# Patient Record
Sex: Male | Born: 1960 | Race: Black or African American | Hispanic: No | Marital: Single | Smoking: Never smoker
Health system: Southern US, Community
[De-identification: ages and names within clinical notes are randomized; demographics above are authoritative.]

## PROBLEM LIST (undated history)

## (undated) DIAGNOSIS — K5900 Constipation, unspecified: Secondary | ICD-10-CM

## (undated) DIAGNOSIS — J329 Chronic sinusitis, unspecified: Secondary | ICD-10-CM

## (undated) DIAGNOSIS — E039 Hypothyroidism, unspecified: Secondary | ICD-10-CM

## (undated) DIAGNOSIS — E7800 Pure hypercholesterolemia, unspecified: Secondary | ICD-10-CM

## (undated) DIAGNOSIS — K219 Gastro-esophageal reflux disease without esophagitis: Secondary | ICD-10-CM

## (undated) DIAGNOSIS — K589 Irritable bowel syndrome without diarrhea: Secondary | ICD-10-CM

## (undated) HISTORY — DX: Constipation, unspecified: K59.00

## (undated) HISTORY — DX: Pure hypercholesterolemia, unspecified: E78.00

## (undated) HISTORY — PX: ESOPHAGOGASTRODUODENOSCOPY: SHX1529

## (undated) HISTORY — PX: WISDOM TOOTH EXTRACTION: SHX21

## (undated) HISTORY — DX: Hypothyroidism, unspecified: E03.9

## (undated) HISTORY — DX: Irritable bowel syndrome, unspecified: K58.9

## (undated) HISTORY — DX: Chronic sinusitis, unspecified: J32.9

## (undated) HISTORY — PX: COLONOSCOPY: SHX174

## (undated) HISTORY — DX: Gastro-esophageal reflux disease without esophagitis: K21.9

---

## 2007-09-28 ENCOUNTER — Ambulatory Visit: Payer: Self-pay | Admitting: Internal Medicine

## 2008-09-23 ENCOUNTER — Ambulatory Visit: Payer: Self-pay | Admitting: Internal Medicine

## 2010-07-16 ENCOUNTER — Ambulatory Visit: Payer: Self-pay | Admitting: Gastroenterology

## 2010-07-19 LAB — PATHOLOGY REPORT

## 2011-01-01 ENCOUNTER — Ambulatory Visit: Payer: Self-pay

## 2014-06-03 ENCOUNTER — Ambulatory Visit: Payer: Self-pay | Admitting: Gastroenterology

## 2014-06-07 LAB — PATHOLOGY REPORT

## 2016-11-21 ENCOUNTER — Telehealth: Payer: Self-pay | Admitting: Gastroenterology

## 2016-11-21 NOTE — Telephone Encounter (Signed)
Left voice message for patient to call and schedule with GI for IBS. [facility] by [facility]

## 2016-11-26 ENCOUNTER — Encounter: Payer: Self-pay | Admitting: Gastroenterology

## 2016-11-26 ENCOUNTER — Telehealth: Payer: Self-pay | Admitting: Gastroenterology

## 2016-11-26 NOTE — Telephone Encounter (Signed)
Left voice message for patient to call and schedule appt with GI for IBS

## 2016-11-28 NOTE — Telephone Encounter (Signed)
error 

## 2016-12-30 ENCOUNTER — Encounter: Payer: Self-pay | Admitting: Gastroenterology

## 2016-12-30 ENCOUNTER — Telehealth: Payer: Self-pay

## 2016-12-30 ENCOUNTER — Other Ambulatory Visit
Admission: RE | Admit: 2016-12-30 | Discharge: 2016-12-30 | Disposition: A | Discharge status: Home / Self Care | Payer: TRICARE For Life (TFL) | Source: Ambulatory Visit | Attending: Gastroenterology | Admitting: Gastroenterology

## 2016-12-30 ENCOUNTER — Other Ambulatory Visit: Payer: Self-pay

## 2016-12-30 ENCOUNTER — Ambulatory Visit (INDEPENDENT_AMBULATORY_CARE_PROVIDER_SITE_OTHER): Payer: TRICARE For Life (TFL) | Admitting: Gastroenterology

## 2016-12-30 VITALS — BP 124/78 | HR 78 | Temp 97.9°F | Ht 67.0 in | Wt 161.4 lb

## 2016-12-30 DIAGNOSIS — K9289 Other specified diseases of the digestive system: Secondary | ICD-10-CM

## 2016-12-30 DIAGNOSIS — R194 Change in bowel habit: Secondary | ICD-10-CM | POA: Diagnosis not present

## 2016-12-30 DIAGNOSIS — Z860100 Personal history of colon polyps, unspecified: Secondary | ICD-10-CM

## 2016-12-30 NOTE — Progress Notes (Signed)
Gastroenterology Consultation  Referring Provider:    [facility]  Primary Care Physician:  [facility] Primary Gastroenterologist:  Dr. Wyline Mood  Reason for Consultation:     IBS        HPI:   Roger Mclaughlin is a 56 y.o. y/o male referred for consultation & management  by Dr. Sander [facility]    He has been referred for IBS. He has last been seen at the [place] [facility] back in 2015. Per last office note in 08/2014 he underwent a colonoscopy  With an adenoma and random colon bx were normal and EGD in 05/2014, father had colon cancer . He has been treated for IBS-C. He has tried [place], linzess in the past .   He says that he is here to discuss about constant cramping and pain in the rectal area, gas pressure, ongoing for years and getting worse.   This occurs daily , the only time he feels good is when he wakes up , after 20 mins has the pressure, feels like he needs to use the restroom, but if he did go to the restroom has a lot of pressure. When he has an enema he is able to get some stool out and get some relief in pressure.   He has a bowel movement without the enema once every day but some in qty, without an enema may not have a bowel movement . He has a sensation of incomplete defecation. When he does have a bowel movement with miralax its not very hard, not hard to flush , often small in size , skinny . This is something new . No blood on stool. He says he only has a "decent bowel movement " only if he sits for 90 mins . He does have a lot of bloating , feels it hard to put on his pants due to distension. The pressure correlates to the bloating , feels better when he passes some gas, Does not wake him up in the night . Worse with dairy .   Chews 10-15 sticks of gum a day for many years, consumes cinnamon discs. Mostly chews in the day .  He does have milder symptoms during the weekend .     Past Medical History:  Diagnosis Date  . Hypercholesteremia     . Hypothyroidism   . Irritable bowel syndrome     No past surgical history on file.  Prior to Admission medications   Medication Sig Start Date End Date Taking? Authorizing Provider  atorvastatin (LIPITOR) 20 MG tablet  12/16/16  Yes Historical Provider, MD  levothyroxine (SYNTHROID, LEVOTHROID) 50 MCG tablet  12/12/16  Yes Historical Provider, MD  SYMAX DUOTAB 0.375 MG TBCR  12/27/16  Yes Historical Provider, MD    Family History  Problem Relation Age of Onset  . Irritable bowel syndrome Mother   . Hypertension Mother   . Diabetes Mother   . Prostate cancer Father   . Hypertension Father   . Diabetes Father   . Irritable bowel syndrome Sister      Social History  Substance Use Topics  . Smoking status: Never Smoker  . Smokeless tobacco: Never Used  . Alcohol use 4.2 oz/week    7 Glasses of wine per week    Allergies as of 12/30/2016  . (No Known Allergies)    Review of Systems:    All systems reviewed and negative except where noted in HPI.   Physical Exam:  BP 124/78 (BP Location: Left Arm, Patient Position: Sitting, Cuff Size: Normal)   Pulse 78   Temp 97.9 F (36.6 C) (Oral)   Ht  (1.702 m)   Wt 161 lb 6.4 oz (73.2 kg)   BMI 25.28 kg/m  No LMP for male patient. Psych:  Alert and cooperative. Normal mood and affect. General:   Alert,  Well-developed, well-nourished, pleasant and cooperative in NAD Head:  Normocephalic and atraumatic. Eyes:  Sclera clear, no icterus.   Conjunctiva pink. Ears:  Normal auditory acuity. Nose:  No deformity, discharge, or lesions. Mouth:  No deformity or lesions,oropharynx pink & moist. Neck:  Supple; no masses or thyromegaly. Lungs:  Respirations even and unlabored.  Clear throughout to auscultation.   No wheezes, crackles, or rhonchi. No acute distress. Heart:  Regular rate and rhythm; no murmurs, clicks, rubs, or gallops. Abdomen:  Normal bowel sounds.  No bruits.  Soft, non-tender and non-distended without masses,  hepatosplenomegaly or hernias noted.  No guarding or rebound tenderness.    Pulses:  Normal pulses noted. Extremities:  No clubbing or edema.  No cyanosis. Neurologic:  Alert and oriented x3;  grossly normal neurologically. Psych:  Alert and cooperative. Normal mood and affect.  Imaging Studies: No results found.  Assessment and Plan:   Roger Mclaughlin is a 56 y.o. y/o male has been referred for IBS-C. His history is more suggetsive of gas and bloating  Secondary to consumption of a large qty of chewing gum with artificial sugar. In addition he has some change in the shape of his stool.   Plan  1. Stop all chewing gum 2. LOW FODMAP diet  3. Check celiac serology  4. Colonoscopy to evaluate change in bowel habits 5. If above does not help then will treat with a course of antibiotics for small bowel bacterial overgrowth syndrome.   Follow up in 8-12 weeks   Dr Wyline Mood MD

## 2016-12-30 NOTE — Telephone Encounter (Signed)
Gastroenterology Pre-Procedure Review  Request Date: 5/3 Requesting Physician: Dr. Tobi Bastos  PATIENT REVIEW QUESTIONS: The patient responded to the following health history questions as indicated:    1. Are you having any GI issues? yes (IBS) 2. Do you have a personal history of Polyps? yes (removed) 3. Do you have a family history of Colon Cancer or Polyps? no 4. Diabetes Mellitus? no 5. Joint replacements in the past 12 months?no 6. Major health problems in the past 3 months?no 7. Any artificial heart valves, MVP, or defibrillator?no    MEDICATIONS & ALLERGIES:    Patient reports the following regarding taking any anticoagulation/antiplatelet therapy:   Plavix, Coumadin, Eliquis, Xarelto, Lovenox, Pradaxa, Brilinta, or Effient? no Aspirin? no  Patient confirms/reports the following medications:  Current Outpatient Prescriptions  Medication Sig Dispense Refill  . atorvastatin (LIPITOR) 20 MG tablet     . levothyroxine (SYNTHROID, LEVOTHROID) 50 MCG tablet     . SYMAX DUOTAB 0.375 MG TBCR      No current facility-administered medications for this visit.     Patient confirms/reports the following allergies:  No Known Allergies  No orders of the defined types were placed in this encounter.   AUTHORIZATION INFORMATION Primary Insurance: 1D#: Group #:  Secondary Insurance: 1D#: Group #:  SCHEDULE INFORMATION: Date: 5/3 Time: Location: [facility]

## 2017-01-01 ENCOUNTER — Telehealth: Payer: Self-pay

## 2017-01-01 ENCOUNTER — Telehealth: Payer: Self-pay | Admitting: Gastroenterology

## 2017-01-01 LAB — CELIAC DISEASE PANEL
ENDOMYSIAL ANTIBODY IGA: NEGATIVE
IgA: 137 mg/dL (ref 90–386)
Tissue Transglutaminase Ab, IgA: <2 U/mL (ref 0–3)

## 2017-01-01 NOTE — Telephone Encounter (Signed)
LVM for patient callback for results, per Dr. Anna.  

## 2017-01-01 NOTE — Telephone Encounter (Signed)
-----   Message from Wyline Mood, MD sent at 01/01/2017 12:07 PM EDT ----- Celiac serology negative

## 2017-01-01 NOTE — Telephone Encounter (Signed)
Roger Mclaughlin returned your call.

## 2017-01-07 ENCOUNTER — Telehealth: Payer: Self-pay | Admitting: Gastroenterology

## 2017-01-07 NOTE — Telephone Encounter (Signed)
01/07/17 Spoke with Jacqulyn Ducking with [facility] and NO prior auth is required for Colonoscopy 57846 / Z86.010.

## 2017-01-21 ENCOUNTER — Other Ambulatory Visit: Payer: Self-pay

## 2017-01-21 DIAGNOSIS — K589 Irritable bowel syndrome without diarrhea: Secondary | ICD-10-CM

## 2017-01-21 MED ORDER — NA SULFATE-K SULFATE-MG SULF 17.5-3.13-1.6 GM/177ML PO SOLN: 1.0000 | Freq: Once | ORAL | 0 refills | Status: AC

## 2017-01-22 ENCOUNTER — Encounter: Payer: Self-pay | Admitting: *Deleted

## 2017-01-23 ENCOUNTER — Ambulatory Visit
Admission: RE | Admit: 2017-01-23 | Discharge: 2017-01-23 | Disposition: A | Discharge status: Home / Self Care | Source: Ambulatory Visit | Attending: Gastroenterology | Admitting: Gastroenterology

## 2017-01-23 ENCOUNTER — Encounter
Admission: RE | Disposition: A | Discharge status: Home / Self Care | Payer: Self-pay | Source: Ambulatory Visit | Attending: Gastroenterology

## 2017-01-23 ENCOUNTER — Encounter: Payer: Self-pay | Admitting: *Deleted

## 2017-01-23 ENCOUNTER — Ambulatory Visit: Admitting: Anesthesiology

## 2017-01-23 DIAGNOSIS — E7800 Pure hypercholesterolemia, unspecified: Secondary | ICD-10-CM | POA: Diagnosis not present

## 2017-01-23 DIAGNOSIS — E039 Hypothyroidism, unspecified: Secondary | ICD-10-CM | POA: Insufficient documentation

## 2017-01-23 DIAGNOSIS — R194 Change in bowel habit: Secondary | ICD-10-CM

## 2017-01-23 DIAGNOSIS — Z79899 Other long term (current) drug therapy: Secondary | ICD-10-CM | POA: Insufficient documentation

## 2017-01-23 DIAGNOSIS — K219 Gastro-esophageal reflux disease without esophagitis: Secondary | ICD-10-CM | POA: Diagnosis not present

## 2017-01-23 DIAGNOSIS — K5730 Diverticulosis of large intestine without perforation or abscess without bleeding: Secondary | ICD-10-CM

## 2017-01-23 DIAGNOSIS — K589 Irritable bowel syndrome without diarrhea: Secondary | ICD-10-CM | POA: Diagnosis not present

## 2017-01-23 DIAGNOSIS — K640 First degree hemorrhoids: Secondary | ICD-10-CM | POA: Insufficient documentation

## 2017-01-23 DIAGNOSIS — Z86010 Personal history of colonic polyps: Secondary | ICD-10-CM

## 2017-01-23 HISTORY — PX: COLONOSCOPY WITH PROPOFOL: SHX5780

## 2017-01-23 SURGERY — COLONOSCOPY WITH PROPOFOL
Anesthesia: General

## 2017-01-23 MED ORDER — SODIUM CHLORIDE 0.9 % IV SOLN
INTRAVENOUS | Status: DC
  Administered 2017-01-23 (×2): via INTRAVENOUS

## 2017-01-23 MED ORDER — PROPOFOL 10 MG/ML IV BOLUS
INTRAVENOUS | Status: DC | PRN
Start: 2017-01-23 — End: 2017-01-23
  Administered 2017-01-23: 100 mg via INTRAVENOUS
  Administered 2017-01-23: 20 mg via INTRAVENOUS

## 2017-01-23 MED ORDER — PROPOFOL 500 MG/50ML IV EMUL
INTRAVENOUS | Status: DC | PRN
  Administered 2017-01-23: 175 ug/kg/min via INTRAVENOUS

## 2017-01-23 MED ORDER — PROPOFOL 500 MG/50ML IV EMUL
INTRAVENOUS | Status: AC
  Filled 2017-01-23: qty 50

## 2017-01-23 NOTE — Anesthesia Post-op Follow-up Note (Cosign Needed)
Anesthesia QCDR form completed.        

## 2017-01-23 NOTE — Op Note (Signed)
[facility] Gastroenterology Patient Name: Roger Mclaughlin Procedure Date: 01/23/2017 11:03 AM MRN: [id number] Account #: [id number] Date of Birth: 20-Oct-1960 Admit Type: Outpatient Age: 56 Room: [facility] ENDO ROOM 4 Gender: Male Note Status: Finalized Procedure:            Colonoscopy Indications:          Incidental change in bowel habits noted Providers:            Wyline Mood MD, MD Referring MD:         No Local Md, MD (Referring MD) Medicines:            Monitored Anesthesia Care Complications:        No immediate complications. Procedure:            Pre-Anesthesia Assessment:                       - Prior to the procedure, a History and Physical was                        performed, and patient medications, allergies and                        sensitivities were reviewed. The patient's tolerance of                        previous anesthesia was reviewed.                       - The risks and benefits of the procedure and the                        sedation options and risks were discussed with the                        patient. All questions were answered and informed                        consent was obtained.                       - ASA Grade Assessment: III - A patient with severe                        systemic disease.                       After obtaining informed consent, the colonoscope was                        passed under direct vision. Throughout the procedure,                        the patient's blood pressure, pulse, and oxygen                        saturations were monitored continuously. The                        Colonoscope was introduced through the anus and  advanced to the the cecum, identified by the                        appendiceal orifice, IC valve and transillumination.                        The colonoscopy was performed with ease. The patient                        tolerated the procedure well. The quality of  the bowel                        preparation was good. Findings:      The perianal and digital rectal examinations were normal.      Non-bleeding internal hemorrhoids were found during retroflexion. The       hemorrhoids were medium-sized and Grade I (internal hemorrhoids that do       not prolapse).      Normal mucosa was found in the entire colon. the mucosa appeared nodular       throughout the colon and biopsies were taken Biopsies were taken with a       cold forceps for histology.      Scattered small-mouthed diverticula were found in the entire colon. Impression:           - Non-bleeding internal hemorrhoids.                       - Normal mucosa in the entire examined colon. Biopsied.                       - Diverticulosis in the entire examined colon. Recommendation:       - Discharge patient to home (with escort).                       - Resume previous diet.                       - Continue present medications.                       - Repeat colonoscopy in 5 years for surveillance.                       - Await pathology results.                       - Return to my office in 8 weeks. Procedure Code(s):    --- Professional ---                       [phone], Colonoscopy, flexible; with biopsy, single or                        multiple Diagnosis Code(s):    --- Professional ---                       K64.0, First degree hemorrhoids                       K57.30, Diverticulosis of large intestine without  perforation or abscess without bleeding CPT copyright 2016 American Medical Association. All rights reserved. The codes documented in this report are preliminary and upon coder review may  be revised to meet current compliance requirements. Wyline Mood, MD Wyline Mood MD, MD 01/23/2017 11:22:06 AM This report has been signed electronically. Number of Addenda: 0 Note Initiated On: 01/23/2017 11:03 AM Scope Withdrawal Time: 0 hours 9 minutes 58 seconds  Total  Procedure Duration: 0 hours 12 minutes 39 seconds       [facility]

## 2017-01-23 NOTE — Transfer of Care (Signed)
Immediate Anesthesia Transfer of Care Note  Patient: Roger Mclaughlin  Procedure(s) Performed: Procedure(s): COLONOSCOPY WITH PROPOFOL (N/A)  Patient Location: PACU  Anesthesia Type:General  Level of Consciousness: sedated  Airway & Oxygen Therapy: Patient Spontanous Breathing and Patient connected to nasal cannula oxygen  Post-op Assessment: Report given to RN and Post -op Vital signs reviewed and stable  Post vital signs: Reviewed and stable  Last Vitals:  Vitals:   01/23/17 0950 01/23/17 1123  BP: 123/86 (P) 99/71  Pulse: 89   Resp: 16   Temp: (!) 35.8 C (P) 36.3 C    Last Pain:  Vitals:   01/23/17 1123  TempSrc: (P) Tympanic         Complications: No apparent anesthesia complications

## 2017-01-23 NOTE — Anesthesia Procedure Notes (Signed)
Date/Time: 01/23/2017 11:11 AM Performed by: Junious SilkNOLES, Roger Jenkinson Pre-anesthesia Checklist: Patient identified, Emergency Drugs available, Suction available, Patient being monitored and Timeout performed Oxygen Delivery Method: Nasal cannula

## 2017-01-23 NOTE — H&P (Signed)
  Wyline MoodKiran Mylin Hirano MD [address] [place], [place] [address] Phone: [phone] Fax : [phone]  Primary Care Physician:  Jenne Pane[facility]lc, [facility] Primary Gastroenterologist:  Dr. Wyline MoodKiran Lanice Folden   Pre-Procedure History & Physical: HPI:  Roger Mclaughlin is a 56 y.o. male is here for an colonoscopy.   Past Medical History:  Diagnosis Date  . Hypercholesteremia   . Hypothyroidism   . Irritable bowel syndrome     Past Surgical History:  Procedure Laterality Date  . COLONOSCOPY    . ESOPHAGOGASTRODUODENOSCOPY    . WISDOM TOOTH EXTRACTION      Prior to Admission medications   Medication Sig Start Date End Date Taking? Authorizing Provider  atorvastatin (LIPITOR) 20 MG tablet  12/16/16  Yes [provider]  levothyroxine (SYNTHROID, LEVOTHROID) 50 MCG tablet  12/12/16  Yes [provider]  SYMAX DUOTAB 0.375 MG TBCR  12/27/16  Yes [provider]    Allergies as of 12/30/2016  . (No Known Allergies)    Family History  Problem Relation Age of Onset  . Irritable bowel syndrome Mother   . Hypertension Mother   . Diabetes Mother   . Prostate cancer Father   . Hypertension Father   . Diabetes Father   . Irritable bowel syndrome Sister     Social History   Social History  . Marital status: Single    Spouse name: N/A  . Number of children: N/A  . Years of education: N/A   Occupational History  . Not on file.   Social History Main Topics  . Smoking status: Never Smoker  . Smokeless tobacco: Never Used  . Alcohol use 4.2 oz/week    7 Glasses of wine per week  . Drug use: No  . Sexual activity: Yes    Birth control/ protection: Condom   Other Topics Concern  . Not on file   Social History Narrative  . No narrative on file    Review of Systems: See HPI, otherwise negative ROS  Physical Exam: BP 123/86   Pulse 89   Temp (!) 96.5 F (35.8 C) (Tympanic)   Resp 16   Ht 5\' 7"  (1.702 m)   Wt 157 lb (71.2 kg)   SpO2 100%    BMI 24.59 kg/m  General:   Alert,  pleasant and cooperative in NAD Head:  Normocephalic and atraumatic. Neck:  Supple; no masses or thyromegaly. Lungs:  Clear throughout to auscultation.    Heart:  Regular rate and rhythm. Abdomen:  Soft, nontender and nondistended. Normal bowel sounds, without guarding, and without rebound.   Neurologic:  Alert and  oriented x4;  grossly normal neurologically.  Impression/Plan: Roger Mclaughlin is here for an colonoscopy to be performed for change in bowel habits   Risks, benefits, limitations, and alternatives regarding  colonoscopy have been reviewed with the patient.  Questions have been answered.  All parties agreeable.   Wyline MoodKiran Paeton Latouche, MD  01/23/2017, 10:27 AM

## 2017-01-23 NOTE — Anesthesia Preprocedure Evaluation (Signed)
Anesthesia Evaluation  Patient identified by MRN, date of birth, ID band Patient awake    Reviewed: Allergy & Precautions, H&P , NPO status , Patient's Chart, lab work & pertinent test results, reviewed documented beta blocker date and time   History of Anesthesia Complications Negative for: history of anesthetic complications  Airway Mallampati: II  TM Distance: >3 FB Neck ROM: full    Dental  (+) Caps, Teeth Intact   Pulmonary neg pulmonary ROS,           Cardiovascular Exercise Tolerance: Good negative cardio ROS       Neuro/Psych negative neurological ROS  negative psych ROS   GI/Hepatic Neg liver ROS, GERD  ,  Endo/Other  neg diabetesHypothyroidism   Renal/GU negative Renal ROS  negative genitourinary   Musculoskeletal   Abdominal   Peds  Hematology negative hematology ROS (+)   Anesthesia Other Findings Past Medical History: No date: Hypercholesteremia No date: Hypothyroidism No date: Irritable bowel syndrome   Reproductive/Obstetrics negative OB ROS                             Anesthesia Physical Anesthesia Plan  ASA: II  Anesthesia Plan: General   Post-op Pain Management:    Induction:   Airway Management Planned:   Additional Equipment:   Intra-op Plan:   Post-operative Plan:   Informed Consent: I have reviewed the patients History and Physical, chart, labs and discussed the procedure including the risks, benefits and alternatives for the proposed anesthesia with the patient or authorized representative who has indicated his/her understanding and acceptance.   Dental Advisory Given  Plan Discussed with: Anesthesiologist, CRNA and Surgeon  Anesthesia Plan Comments:         Anesthesia Quick Evaluation

## 2017-01-24 ENCOUNTER — Encounter: Payer: Self-pay | Admitting: Gastroenterology

## 2017-01-24 LAB — SURGICAL PATHOLOGY

## 2017-01-24 NOTE — Anesthesia Postprocedure Evaluation (Signed)
Anesthesia Post Note  Patient: Roger Mclaughlin  Procedure(s) Performed: Procedure(s) (LRB): COLONOSCOPY WITH PROPOFOL (N/A)  Patient location during evaluation: Endoscopy Anesthesia Type: General Level of consciousness: awake and alert Pain management: pain level controlled Vital Signs Assessment: post-procedure vital signs reviewed and stable Respiratory status: spontaneous breathing, nonlabored ventilation, respiratory function stable and patient connected to nasal cannula oxygen Cardiovascular status: blood pressure returned to baseline and stable Postop Assessment: no signs of nausea or vomiting Anesthetic complications: no     Last Vitals:  Vitals:   01/23/17 1143 01/23/17 1153  BP: (!) 119/93 (!) 128/91  Pulse: 71 78  Resp: 17 20  Temp:      Last Pain:  Vitals:   01/23/17 1123  TempSrc: Tympanic  PainSc: Asleep                 Lenard SimmerAndrew Achol Azpeitia

## 2017-01-27 ENCOUNTER — Encounter: Payer: Self-pay | Admitting: Gastroenterology

## 2017-01-27 NOTE — Progress Notes (Signed)
5 year colonoscopy due to family history

## 2017-10-29 ENCOUNTER — Telehealth: Payer: Self-pay | Admitting: Internal Medicine

## 2017-10-29 ENCOUNTER — Encounter: Payer: Self-pay | Admitting: Internal Medicine

## 2017-10-29 ENCOUNTER — Ambulatory Visit (INDEPENDENT_AMBULATORY_CARE_PROVIDER_SITE_OTHER): Admitting: Internal Medicine

## 2017-10-29 VITALS — BP 144/91 | HR 82 | Temp 97.4°F | Resp 16 | Ht 67.0 in | Wt 161.2 lb

## 2017-10-29 DIAGNOSIS — R030 Elevated blood-pressure reading, without diagnosis of hypertension: Secondary | ICD-10-CM | POA: Diagnosis not present

## 2017-10-29 DIAGNOSIS — Z0001 Encounter for general adult medical examination with abnormal findings: Secondary | ICD-10-CM

## 2017-10-29 DIAGNOSIS — E785 Hyperlipidemia, unspecified: Secondary | ICD-10-CM | POA: Diagnosis not present

## 2017-10-29 DIAGNOSIS — J0100 Acute maxillary sinusitis, unspecified: Secondary | ICD-10-CM

## 2017-10-29 MED ORDER — PREDNISONE 10 MG PO TABS: ORAL_TABLET | ORAL | 0 refills | Status: DC

## 2017-10-29 MED ORDER — AMOXICILLIN-POT CLAVULANATE 875-125 MG PO TABS: 1.0000 | ORAL_TABLET | Freq: Two times a day (BID) | ORAL | 0 refills | Status: DC

## 2017-10-29 NOTE — Progress Notes (Signed)
[facility] [address] [place], [place] [address]  Internal MEDICINE  Office Visit Note  Patient Name: Roger Mclaughlin  [id number]  [id number]  Date of Service: 10/29/2017  Chief Complaint  Patient presents with  . Sinus Problem    possible sinus infection    HPI Pt is here for a sick visit. Sinus congestion with postnasal drip Slight cough  Current Medication:  Outpatient Encounter Medications as of 10/29/2017  Medication Sig  . Acetaminophen-Caffeine (EXCEDRIN ASPIRIN FREE PO) Take by mouth daily.  Marland Kitchen atorvastatin (LIPITOR) 20 MG tablet Take 20 mg by mouth every evening.   Marland Kitchen esomeprazole (NEXIUM) 40 MG capsule Take 20 mg by mouth daily.  Marland Kitchen levothyroxine (SYNTHROID, LEVOTHROID) 50 MCG tablet Take 50 mcg by mouth daily before breakfast.   . polyethylene glycol powder (MIRALAX) powder Take 1 Container by mouth every evening.  Marland Kitchen SYMAX DUOTAB 0.375 MG TBCR daily.    No facility-administered encounter medications on file as of 10/29/2017.     Medical History: Past Medical History:  Diagnosis Date  . Chronic sinusitis   . Constipation   . GERD (gastroesophageal reflux disease)   . Hypercholesteremia   . Hypothyroidism   . Irritable bowel syndrome     Vital Signs: BP (!) 144/91   Pulse 82   Temp (!) 97.4 F (36.3 C)   Resp 16   Ht 5\' 7"  (1.702 m)   Wt 161 lb 3.2 oz (73.1 kg)   SpO2 99%   BMI 25.25 kg/m    Review of Systems  Constitutional: Negative for chills, fatigue and unexpected weight change.  HENT: Positive for postnasal drip and sore throat. Negative for congestion, rhinorrhea and sneezing.   Eyes: Negative for redness.  Respiratory: Positive for cough. Negative for chest tightness and shortness of breath.   Cardiovascular: Negative for chest pain and palpitations.  Gastrointestinal: Negative for abdominal pain, constipation, diarrhea, nausea and vomiting.  Genitourinary: Negative for dysuria and frequency.  Musculoskeletal: Negative for  arthralgias, back pain, joint swelling and neck pain.  Skin: Negative for rash.  Neurological: Negative.  Negative for tremors and numbness.  Hematological: Negative for adenopathy. Does not bruise/bleed easily.  Psychiatric/Behavioral: Negative for behavioral problems (Depression), sleep disturbance and suicidal ideas. The patient is not nervous/anxious.     Physical Exam  Constitutional: He is oriented to person, place, and time. He appears well-developed and well-nourished. No distress.  HENT:  Head: Normocephalic and atraumatic.  Mouth/Throat: Oropharynx is clear and moist. No oropharyngeal exudate.  Eyes: EOM are normal. Pupils are equal, round, and reactive to light.  Neck: Normal range of motion. Neck supple.  Cardiovascular: Normal rate, regular rhythm and normal heart sounds. Exam reveals no gallop and no friction rub.  No murmur heard. Pulmonary/Chest: Effort normal. He has no wheezes. He has no rales. He exhibits no tenderness.  Abdominal: Soft. Bowel sounds are normal.  Musculoskeletal: Normal range of motion.  Neurological: He is alert and oriented to person, place, and time. No cranial nerve deficit.  Skin: Skin is warm and dry. He is not diaphoretic.  Psychiatric: Judgment normal.    Assessment/Plan: 1. Acute non-recurrent maxillary sinusitis - Add Flonase  - amoxicillin-clavulanate (AUGMENTIN) 875-125 MG tablet; Take 1 tablet by mouth 2 (two) times daily.  Dispense: 20 tablet; Refill: 0 - predniSONE (DELTASONE) 10 MG tablet; 2 tabs 2 x day for 2 days  Dispense: 6 tablet; Refill: 0  2. Elevated blood pressure reading - Monitor for mow   3.  Hyperlipoproteinemia - Continue lipitor   General Counseling: Roger Mclaughlin verbalizes understanding of the findings of todays visit and agrees with plan of treatment. I have discussed any further diagnostic evaluation that may be needed or ordered today. We also reviewed his medications today. he has been encouraged to call the office  with any questions or concerns that should arise related to todays visit.   Time spent:25  Minutes

## 2017-11-13 ENCOUNTER — Other Ambulatory Visit: Payer: Self-pay | Admitting: Internal Medicine

## 2017-11-15 LAB — LIPID PANEL WITH LDL/HDL RATIO
Cholesterol, Total: 171 mg/dL (ref 100–199)
HDL: 69 mg/dL (ref >39)
LDL Calculated: 91 mg/dL (ref 0–99)
LDL/HDL RATIO: 1.3 ratio (ref 0.0–3.6)
TRIGLYCERIDES: 57 mg/dL (ref 0–149)
VLDL CHOLESTEROL CAL: 11 mg/dL (ref 5–40)

## 2017-11-15 LAB — CBC WITH DIFFERENTIAL/PLATELET
BASOS: 0 %
Basophils Absolute: 0 10*3/uL (ref 0.0–0.2)
EOS (ABSOLUTE): 0.1 10*3/uL (ref 0.0–0.4)
EOS: 3 %
HEMATOCRIT: 39.3 % (ref 37.5–51.0)
Hemoglobin: 13.3 g/dL (ref 13.0–17.7)
IMMATURE GRANS (ABS): 0 10*3/uL (ref 0.0–0.1)
IMMATURE GRANULOCYTES: 0 %
LYMPHS: 46 %
Lymphocytes Absolute: 1.7 10*3/uL (ref 0.7–3.1)
MCH: 29.9 pg (ref 26.6–33.0)
MCHC: 33.8 g/dL (ref 31.5–35.7)
MCV: 88 fL (ref 79–97)
MONOCYTES: 6 %
MONOS ABS: 0.2 10*3/uL (ref 0.1–0.9)
NEUTROS PCT: 45 %
Neutrophils Absolute: 1.7 10*3/uL (ref 1.4–7.0)
Platelets: 175 10*3/uL (ref 150–379)
RBC: 4.45 x10E6/uL (ref 4.14–5.80)
RDW: 13.5 % (ref 12.3–15.4)
WBC: 3.7 10*3/uL (ref 3.4–10.8)

## 2017-11-15 LAB — COMPREHENSIVE METABOLIC PANEL
ALBUMIN: 4.2 g/dL (ref 3.5–5.5)
ALT: 38 IU/L (ref 0–44)
AST: 30 IU/L (ref 0–40)
Albumin/Globulin Ratio: 1.8 (ref 1.2–2.2)
Alkaline Phosphatase: 74 IU/L (ref 39–117)
BILIRUBIN TOTAL: 0.5 mg/dL (ref 0.0–1.2)
BUN / CREAT RATIO: 13 (ref 9–20)
BUN: 15 mg/dL (ref 6–24)
CO2: 24 mmol/L (ref 20–29)
CREATININE: 1.2 mg/dL (ref 0.76–1.27)
Calcium: 9.5 mg/dL (ref 8.7–10.2)
Chloride: 105 mmol/L (ref 96–106)
GFR calc Af Amer: 78 mL/min/{1.73_m2} (ref >59)
GFR calc non Af Amer: 68 mL/min/{1.73_m2} (ref >59)
GLOBULIN, TOTAL: 2.4 g/dL (ref 1.5–4.5)
GLUCOSE: 90 mg/dL (ref 65–99)
Potassium: 4.5 mmol/L (ref 3.5–5.2)
Sodium: 142 mmol/L (ref 134–144)
TOTAL PROTEIN: 6.6 g/dL (ref 6.0–8.5)

## 2017-11-15 LAB — T4, FREE: Free T4: 1.13 ng/dL (ref 0.82–1.77)

## 2017-11-15 LAB — PSA: Prostate Specific Ag, Serum: 0.8 ng/mL (ref 0.0–4.0)

## 2017-11-15 LAB — TSH: TSH: 0.476 u[IU]/mL (ref 0.450–4.500)

## 2017-11-18 ENCOUNTER — Encounter: Payer: Self-pay | Admitting: Nurse Practitioner

## 2017-12-25 ENCOUNTER — Ambulatory Visit: Admitting: Nurse Practitioner

## 2017-12-25 ENCOUNTER — Encounter: Payer: Self-pay | Admitting: Nurse Practitioner

## 2017-12-25 VITALS — BP 137/93 | HR 74 | Resp 16 | Ht 67.0 in | Wt 160.0 lb

## 2017-12-25 DIAGNOSIS — K219 Gastro-esophageal reflux disease without esophagitis: Secondary | ICD-10-CM

## 2017-12-25 DIAGNOSIS — R300 Dysuria: Secondary | ICD-10-CM

## 2017-12-25 DIAGNOSIS — E039 Hypothyroidism, unspecified: Secondary | ICD-10-CM

## 2017-12-25 DIAGNOSIS — R1084 Generalized abdominal pain: Secondary | ICD-10-CM

## 2017-12-25 DIAGNOSIS — Z0001 Encounter for general adult medical examination with abnormal findings: Secondary | ICD-10-CM | POA: Diagnosis not present

## 2017-12-25 NOTE — Progress Notes (Signed)
[facility] [address] [place], [place] [address]  Internal MEDICINE  Office Visit Note  Patient Name: Roger Mclaughlin  [id number]  [id number]  Date of Service: 01/14/2018    Pt is here for routine health maintenance examination  Chief Complaint  Patient presents with  . Annual Exam  . Abdominal Pain    gas/cramping/diarrhea     The patient is here for health maintenance exam. He c/o chronic and intermittent abdominal pain. Present for years. Has been treated for IBS which does not seem to help at all. Colonoscopy in 01/2017 only showed internal hemorrhoids and diverticulosis.    Current Medication: Outpatient Encounter Medications as of 12/25/2017  Medication Sig  . Acetaminophen-Caffeine (EXCEDRIN ASPIRIN FREE PO) Take by mouth daily.  Marland Kitchen atorvastatin (LIPITOR) 20 MG tablet Take 20 mg by mouth every evening.   Marland Kitchen esomeprazole (NEXIUM) 40 MG capsule TAKE 1 CAPSULE DAILY FOR REFLUX  . levothyroxine (SYNTHROID, LEVOTHROID) 50 MCG tablet Take 50 mcg by mouth daily before breakfast.   . polyethylene glycol powder (MIRALAX) powder Take 1 Container by mouth every evening.  Marland Kitchen SYMAX DUOTAB 0.375 MG TBCR daily.   . predniSONE (DELTASONE) 10 MG tablet 2 tabs 2 x day for 2 days (Patient not taking: Reported on 12/25/2017)  . [DISCONTINUED] amoxicillin-clavulanate (AUGMENTIN) 875-125 MG tablet Take 1 tablet by mouth 2 (two) times daily. (Patient not taking: Reported on 12/25/2017)   No facility-administered encounter medications on file as of 12/25/2017.     Surgical History: Past Surgical History:  Procedure Laterality Date  . COLONOSCOPY    . COLONOSCOPY WITH PROPOFOL N/A 01/23/2017   Procedure: COLONOSCOPY WITH PROPOFOL;  Surgeon: Wyline Mood, MD;  Location: [facility] ENDOSCOPY;  Service: Endoscopy;  Laterality: N/A;  . ESOPHAGOGASTRODUODENOSCOPY    . WISDOM TOOTH EXTRACTION      Medical History: Past Medical History:  Diagnosis Date  . Chronic sinusitis   . Constipation    . GERD (gastroesophageal reflux disease)   . Hypercholesteremia   . Hypothyroidism   . Irritable bowel syndrome     Family History: Family History  Problem Relation Age of Onset  . Irritable bowel syndrome Mother   . Hypertension Mother   . Diabetes Mother   . Prostate cancer Father   . Hypertension Father   . Diabetes Father   . Irritable bowel syndrome Sister       Review of Systems  Constitutional: Positive for appetite change. Negative for chills, fatigue and unexpected weight change.  HENT: Negative for congestion, postnasal drip, rhinorrhea, sneezing and sore throat.   Eyes: Negative.  Negative for redness.  Respiratory: Negative for cough, chest tightness and shortness of breath.   Cardiovascular: Negative for chest pain and palpitations.  Gastrointestinal: Positive for abdominal pain, constipation and diarrhea. Negative for nausea and vomiting.       IBS - alternates between constipation and diarrhea.   Endocrine:       Well managed thyroid disease.   Genitourinary: Negative for dysuria and frequency.  Musculoskeletal: Negative for arthralgias, back pain, joint swelling and neck pain.  Skin: Negative for rash.  Allergic/Immunologic: Positive for environmental allergies.  Neurological: Positive for headaches. Negative for tremors and numbness.  Hematological: Negative for adenopathy. Does not bruise/bleed easily.  Psychiatric/Behavioral: Negative for behavioral problems (Depression), sleep disturbance and suicidal ideas. The patient is nervous/anxious.      Today's Vitals   12/25/17 1456  BP: (!) 137/93  Pulse: 74  Resp: 16  SpO2: 94%  Weight: 160 lb (72.6 kg)  Height: 5\' 7"  (1.702 m)    Physical Exam  Constitutional: He is oriented to person, place, and time. He appears well-developed and well-nourished. No distress.  HENT:  Head: Normocephalic and atraumatic.  Mouth/Throat: Oropharynx is clear and moist. No oropharyngeal exudate.  Eyes: Pupils are  equal, round, and reactive to light. EOM are normal.  Neck: Normal range of motion. Neck supple. No JVD present. No tracheal deviation present. No thyromegaly present.  Cardiovascular: Normal rate, regular rhythm, normal heart sounds and intact distal pulses. Exam reveals no gallop and no friction rub.  No murmur heard. Pulmonary/Chest: Effort normal and breath sounds normal. No respiratory distress. He has no wheezes. He has no rales. He exhibits no tenderness.  Abdominal: Soft. Normal appearance and bowel sounds are normal. There is no tenderness.  Musculoskeletal: Normal range of motion.  Lymphadenopathy:    He has no cervical adenopathy.  Neurological: He is alert and oriented to person, place, and time. No cranial nerve deficit.  Skin: Skin is warm and dry. Capillary refill takes less than 2 seconds. He is not diaphoretic.  Psychiatric: He has a normal mood and affect. His behavior is normal. Judgment and thought content normal.  Nursing note and vitals reviewed.    LABS: Recent Results (from the past 2160 hour(s))  CBC with Differential/Platelet     Status: None   Collection Time: 11/14/17  8:58 AM  Result Value Ref Range   WBC 3.7 3.4 - 10.8 x10E3/uL   RBC 4.45 4.14 - 5.80 x10E6/uL   Hemoglobin 13.3 13.0 - 17.7 g/dL   Hematocrit 16.139.3 09.637.5 - 51.0 %   MCV 88 79 - 97 fL   MCH 29.9 26.6 - 33.0 pg   MCHC 33.8 31.5 - 35.7 g/dL   RDW 04.513.5 40.912.3 - 81.115.4 %   Platelets 175 150 - 379 x10E3/uL   Neutrophils 45 Not Estab. %   Lymphs 46 Not Estab. %   Monocytes 6 Not Estab. %   Eos 3 Not Estab. %   Basos 0 Not Estab. %   Neutrophils Absolute 1.7 1.4 - 7.0 x10E3/uL   Lymphocytes Absolute 1.7 0.7 - 3.1 x10E3/uL   Monocytes Absolute 0.2 0.1 - 0.9 x10E3/uL   EOS (ABSOLUTE) 0.1 0.0 - 0.4 x10E3/uL   Basophils Absolute 0.0 0.0 - 0.2 x10E3/uL   Immature Granulocytes 0 Not Estab. %   Immature Grans (Abs) 0.0 0.0 - 0.1 x10E3/uL  Lipid Panel With LDL/HDL Ratio     Status: None   Collection  Time: 11/14/17  8:58 AM  Result Value Ref Range   Cholesterol, Total 171 100 - 199 mg/dL   Triglycerides 57 0 - 149 mg/dL   HDL 69 >91>39 mg/dL   VLDL Cholesterol Cal 11 5 - 40 mg/dL   LDL Calculated 91 0 - 99 mg/dL   LDl/HDL Ratio 1.3 0.0 - 3.6 ratio    Comment:                                     LDL/HDL Ratio                                             Men  Women  1/2 Avg.Risk  1.0    1.5                                   Avg.Risk  3.6    3.2                                2X Avg.Risk  6.2    5.0                                3X Avg.Risk  8.0    6.1   TSH     Status: None   Collection Time: 11/14/17  8:58 AM  Result Value Ref Range   TSH 0.476 0.450 - 4.500 uIU/mL  T4, free     Status: None   Collection Time: 11/14/17  8:58 AM  Result Value Ref Range   Free T4 1.13 0.82 - 1.77 ng/dL  Comprehensive metabolic panel     Status: None   Collection Time: 11/14/17  8:58 AM  Result Value Ref Range   Glucose 90 65 - 99 mg/dL   BUN 15 6 - 24 mg/dL   Creatinine, Ser 1.61 0.76 - 1.27 mg/dL   GFR calc non Af Amer 68 >59 mL/min/1.73   GFR calc Af Amer 78 >59 mL/min/1.73   BUN/Creatinine Ratio 13 9 - 20   Sodium 142 134 - 144 mmol/L   Potassium 4.5 3.5 - 5.2 mmol/L   Chloride 105 96 - 106 mmol/L   CO2 24 20 - 29 mmol/L   Calcium 9.5 8.7 - 10.2 mg/dL   Total Protein 6.6 6.0 - 8.5 g/dL   Albumin 4.2 3.5 - 5.5 g/dL   Globulin, Total 2.4 1.5 - 4.5 g/dL   Albumin/Globulin Ratio 1.8 1.2 - 2.2   Bilirubin Total 0.5 0.0 - 1.2 mg/dL   Alkaline Phosphatase 74 39 - 117 IU/L   AST 30 0 - 40 IU/L   ALT 38 0 - 44 IU/L  PSA     Status: None   Collection Time: 11/14/17  8:58 AM  Result Value Ref Range   Prostate Specific Ag, Serum 0.8 0.0 - 4.0 ng/mL    Comment: Roche ECLIA methodology. According to the American Urological Association, Serum PSA should decrease and remain at undetectable levels after radical prostatectomy. The AUA defines biochemical recurrence as  an initial PSA value 0.2 ng/mL or greater followed by a subsequent confirmatory PSA value 0.2 ng/mL or greater. Values obtained with different assay methods or kits cannot be used interchangeably. Results cannot be interpreted as absolute evidence of the presence or absence of malignant disease.   Urinalysis, Routine w reflex microscopic     Status: Abnormal   Collection Time: 12/25/17  3:10 PM  Result Value Ref Range   Specific Gravity, UA 1.006 1.005 - 1.030   pH, UA 8.0 (H) 5.0 - 7.5   Color, UA Yellow Yellow   Appearance Ur Clear Clear   Leukocytes, UA Negative Negative   Protein, UA Negative Negative/Trace   Glucose, UA Negative Negative   Ketones, UA Negative Negative   RBC, UA Negative Negative   Bilirubin, UA Negative Negative   Urobilinogen, Ur 0.2 0.2 - 1.0 mg/dL   Nitrite, UA Negative Negative   Microscopic Examination Comment     Comment: Microscopic not  indicated and not performed.    Assessment/Plan: 1. Encounter for general adult medical examination with abnormal findings Annual health maintenance exam today.  2. Generalized colicky abdominal pain Check labs, including amylase and lipase. Will get abdominal ultrasound for further evaluation.  - Lipase - Amylase - US Abdomen Complete; Future  3. Acquired hypothyroidism Thyroid panel stable. Continue levothyroxine as prescribed.   4. Gastroesophageal reflux disease without esophagitis Continue PPI as prescribed.  5. Dysuria - Urinalysis, Routine w reflex microscopic   General Counseling: ashland wiseman understanding of the findings of todays visit and agrees with plan of treatment. I have discussed any further diagnostic evaluation that may be needed or ordered today. We also reviewed his medications today. he has been encouraged to call the office with any questions or concerns that should arise related to todays visit.    This patient was seen by Vincent Gros, FNP- C in Collaboration with Dr  Lyndon Code as a part of collaborative care agreement    Orders Placed This Encounter  Procedures  . US Abdomen Complete  . Urinalysis, Routine w reflex microscopic  . Lipase  . Amylase      Time spent: 87 Minutes    Lyndon Code, MD  Internal Medicine

## 2017-12-26 LAB — URINALYSIS, ROUTINE W REFLEX MICROSCOPIC
BILIRUBIN UA: NEGATIVE
GLUCOSE, UA: NEGATIVE
KETONES UA: NEGATIVE
Leukocytes, UA: NEGATIVE
Nitrite, UA: NEGATIVE
PROTEIN UA: NEGATIVE
RBC UA: NEGATIVE
SPEC GRAV UA: 1.006 (ref 1.005–1.030)
UUROB: 0.2 mg/dL (ref 0.2–1.0)
pH, UA: 8 — ABNORMAL HIGH (ref 5.0–7.5)

## 2018-01-14 DIAGNOSIS — E039 Hypothyroidism, unspecified: Secondary | ICD-10-CM | POA: Insufficient documentation

## 2018-01-14 DIAGNOSIS — Z0001 Encounter for general adult medical examination with abnormal findings: Secondary | ICD-10-CM | POA: Insufficient documentation

## 2018-01-14 DIAGNOSIS — R300 Dysuria: Secondary | ICD-10-CM | POA: Insufficient documentation

## 2018-01-14 DIAGNOSIS — K219 Gastro-esophageal reflux disease without esophagitis: Secondary | ICD-10-CM | POA: Insufficient documentation

## 2018-01-14 DIAGNOSIS — R1084 Generalized abdominal pain: Secondary | ICD-10-CM | POA: Insufficient documentation

## 2018-01-20 LAB — AMYLASE: Amylase: 109 U/L (ref 31–124)

## 2018-01-20 LAB — LIPASE: Lipase: 46 U/L (ref 13–78)

## 2018-02-06 ENCOUNTER — Other Ambulatory Visit: Payer: Self-pay

## 2018-02-13 ENCOUNTER — Ambulatory Visit

## 2018-02-13 DIAGNOSIS — R1084 Generalized abdominal pain: Secondary | ICD-10-CM | POA: Diagnosis not present

## 2018-03-09 ENCOUNTER — Other Ambulatory Visit: Payer: Self-pay | Admitting: Internal Medicine

## 2018-03-10 ENCOUNTER — Other Ambulatory Visit: Payer: Self-pay

## 2018-03-10 MED ORDER — SYMAX DUOTAB 0.375 MG PO TBCR: EXTENDED_RELEASE_TABLET | ORAL | 1 refills | Status: DC

## 2018-03-25 ENCOUNTER — Other Ambulatory Visit: Payer: Self-pay

## 2018-03-25 MED ORDER — SYMAX DUOTAB 0.375 MG PO TBCR: EXTENDED_RELEASE_TABLET | ORAL | 2 refills | Status: DC

## 2018-06-09 ENCOUNTER — Other Ambulatory Visit: Payer: Self-pay | Admitting: Internal Medicine

## 2018-07-02 ENCOUNTER — Encounter: Payer: Self-pay | Admitting: Nurse Practitioner

## 2018-07-02 ENCOUNTER — Ambulatory Visit (INDEPENDENT_AMBULATORY_CARE_PROVIDER_SITE_OTHER): Admitting: Nurse Practitioner

## 2018-07-02 VITALS — BP 130/90 | HR 75 | Resp 16 | Ht 67.5 in | Wt 161.4 lb

## 2018-07-02 DIAGNOSIS — E039 Hypothyroidism, unspecified: Secondary | ICD-10-CM

## 2018-07-02 DIAGNOSIS — K581 Irritable bowel syndrome with constipation: Secondary | ICD-10-CM | POA: Diagnosis not present

## 2018-07-02 DIAGNOSIS — F411 Generalized anxiety disorder: Secondary | ICD-10-CM

## 2018-07-02 DIAGNOSIS — R5383 Other fatigue: Secondary | ICD-10-CM | POA: Diagnosis not present

## 2018-07-02 MED ORDER — ESCITALOPRAM OXALATE 10 MG PO TABS: 10.0000 mg | ORAL_TABLET | Freq: Every day | ORAL | 3 refills | Status: DC

## 2018-07-02 NOTE — Progress Notes (Signed)
[facility] [address] [place], [place] [address]  Internal MEDICINE  Office Visit Note  Patient Name: Roger Mclaughlin  [id number]  [id number]  Date of Service: 07/05/2018  Chief Complaint  Patient presents with  . Medical Management of Chronic Issues    6 month follow up  . Labs Only    review labs and ultrasound   . Hyperlipidemia  . Gastroesophageal Reflux  . Hypothyroidism    The patient states that he is feeling more fatigued recently. Falling asleep while watching TV around 10:30 at night.  Continues to have intermittent abdominal pain and irritable bowel syndrome. He leans on the side of ibs with constipation. Controls his constipation with OTC regimen of stool softeners and fiber supplementation.      Current Medication: Outpatient Encounter Medications as of 07/02/2018  Medication Sig  . Acetaminophen-Caffeine (EXCEDRIN ASPIRIN FREE PO) Take by mouth daily.  Marland Kitchen atorvastatin (LIPITOR) 20 MG tablet TAKE 1 TABLET AT BEDTIME  . esomeprazole (NEXIUM) 40 MG capsule TAKE 1 CAPSULE DAILY FOR REFLUX  . polyethylene glycol powder (MIRALAX) powder Take 1 Container by mouth every evening.  Marland Kitchen SYMAX DUOTAB 0.375 MG TBCR take 1 tab twice daily  . SYNTHROID 50 MCG tablet TAKE 1 TABLET EVERY MORNING BEFORE BREAKFAST  . escitalopram (LEXAPRO) 10 MG tablet Take 1 tablet (10 mg total) by mouth daily.  . predniSONE (DELTASONE) 10 MG tablet 2 tabs 2 x day for 2 days (Patient not taking: Reported on 12/25/2017)   No facility-administered encounter medications on file as of 07/02/2018.     Surgical History: Past Surgical History:  Procedure Laterality Date  . COLONOSCOPY    . COLONOSCOPY WITH PROPOFOL N/A 01/23/2017   Procedure: COLONOSCOPY WITH PROPOFOL;  Surgeon: Wyline Mood, MD;  Location: [facility] ENDOSCOPY;  Service: Endoscopy;  Laterality: N/A;  . ESOPHAGOGASTRODUODENOSCOPY    . WISDOM TOOTH EXTRACTION      Medical History: Past Medical History:  Diagnosis Date   . Chronic sinusitis   . Constipation   . GERD (gastroesophageal reflux disease)   . Hypercholesteremia   . Hypothyroidism   . Irritable bowel syndrome     Family History: Family History  Problem Relation Age of Onset  . Irritable bowel syndrome Mother   . Hypertension Mother   . Diabetes Mother   . Prostate cancer Father   . Hypertension Father   . Diabetes Father   . Irritable bowel syndrome Sister     Social History   Socioeconomic History  . Marital status: Single    Spouse name: Not on file  . Number of children: Not on file  . Years of education: Not on file  . Highest education level: Not on file  Occupational History  . Not on file  Social Needs  . Financial resource strain: Not on file  . Food insecurity:    Worry: Not on file    Inability: Not on file  . Transportation needs:    Medical: Not on file    Non-medical: Not on file  Tobacco Use  . Smoking status: Never Smoker  . Smokeless tobacco: Never Used  Substance and Sexual Activity  . Alcohol use: Yes    Alcohol/week: 7.0 standard drinks    Types: 7 Glasses of wine per week  . Drug use: No  . Sexual activity: Yes    Birth control/protection: Condom  Lifestyle  . Physical activity:    Days per week: Not on file    Minutes per  session: Not on file  . Stress: Not on file  Relationships  . Social connections:    Talks on phone: Not on file    Gets together: Not on file    Attends religious service: Not on file    Active member of club or organization: Not on file    Attends meetings of clubs or organizations: Not on file    Relationship status: Not on file  . Intimate partner violence:    Fear of current or ex partner: Not on file    Emotionally abused: Not on file    Physically abused: Not on file    Forced sexual activity: Not on file  Other Topics Concern  . Not on file  Social History Narrative  . Not on file      Review of Systems  Constitutional: Positive for fatigue. Negative  for appetite change, chills and unexpected weight change.  HENT: Negative for congestion, postnasal drip, rhinorrhea, sneezing and sore throat.   Eyes: Negative.  Negative for redness.  Respiratory: Negative for cough, chest tightness and shortness of breath.   Cardiovascular: Negative for chest pain and palpitations.  Gastrointestinal: Positive for abdominal pain and constipation. Negative for diarrhea, nausea and vomiting.       IBS - mostly constipation  Endocrine: Negative for cold intolerance, heat intolerance, polydipsia, polyphagia and polyuria.       Well managed thyroid disease.   Genitourinary: Negative.  Negative for dysuria and frequency.  Musculoskeletal: Negative for arthralgias, back pain, joint swelling and neck pain.  Skin: Negative for rash.  Allergic/Immunologic: Positive for environmental allergies.  Neurological: Positive for headaches. Negative for tremors and numbness.  Hematological: Negative for adenopathy. Does not bruise/bleed easily.  Psychiatric/Behavioral: Positive for dysphoric mood. Negative for behavioral problems (Depression), sleep disturbance and suicidal ideas. The patient is nervous/anxious.        Patient has noted increased depression and anxiety recently.    Today's Vitals   07/02/18 1532  BP: 130/90  Pulse: 75  Resp: 16  SpO2: 100%  Weight: 161 lb 6.4 oz (73.2 kg)  Height: 5' 7.5" (1.715 m)    Physical Exam  Constitutional: He is oriented to person, place, and time. He appears well-developed and well-nourished. No distress.  HENT:  Head: Normocephalic and atraumatic.  Nose: Nose normal.  Mouth/Throat: Oropharynx is clear and moist. No oropharyngeal exudate.  Eyes: Pupils are equal, round, and reactive to light. Conjunctivae and EOM are normal.  Neck: Normal range of motion. Neck supple. No JVD present. No tracheal deviation present. No thyromegaly present.  Cardiovascular: Normal rate, regular rhythm and normal heart sounds. Exam reveals  no gallop and no friction rub.  No murmur heard. Pulmonary/Chest: Effort normal and breath sounds normal. No respiratory distress. He has no wheezes. He has no rales. He exhibits no tenderness.  Abdominal: Soft. Normal appearance and bowel sounds are normal. There is no tenderness.  Musculoskeletal: Normal range of motion.  Lymphadenopathy:    He has no cervical adenopathy.  Neurological: He is alert and oriented to person, place, and time. No cranial nerve deficit.  Skin: Skin is warm and dry. Capillary refill takes less than 2 seconds. He is not diaphoretic.  Psychiatric: He has a normal mood and affect. His behavior is normal. Judgment and thought content normal.  Nursing note and vitals reviewed.  Assessment/Plan: 1. Other fatigue Will check thyroid panel and blood count for further evaluation.  2. Acquired hypothyroidism Check thyroid panel and adjust levothyroxine as  indicated   3. Irritable bowel syndrome with constipation Continue with OTC stool softener and fiber supplementation as before.   4. Generalized anxiety disorder Add lexapro 10mg  daily. May be contributing to increased fatigue. Reassess at his next visit.  - escitalopram (LEXAPRO) 10 MG tablet; Take 1 tablet (10 mg total) by mouth daily.  Dispense: 30 tablet; Refill: 3  General Counseling: dawit tankard understanding of the findings of todays visit and agrees with plan of treatment. I have discussed any further diagnostic evaluation that may be needed or ordered today. We also reviewed his medications today. he has been encouraged to call the office with any questions or concerns that should arise related to todays visit.   This patient was seen by Vincent Gros FNP Collaboration with Dr Lyndon Code as a part of collaborative care agreement  Meds ordered this encounter  Medications  . escitalopram (LEXAPRO) 10 MG tablet    Sig: Take 1 tablet (10 mg total) by mouth daily.    Dispense:  30 tablet    Refill:   3    Order Specific Question:   Supervising Provider    Answer:   Lyndon Code [[id number]]    Time spent: 46 Minutes      Dr Lyndon Code Internal medicine

## 2018-07-05 DIAGNOSIS — K581 Irritable bowel syndrome with constipation: Secondary | ICD-10-CM | POA: Insufficient documentation

## 2018-07-05 DIAGNOSIS — F411 Generalized anxiety disorder: Secondary | ICD-10-CM | POA: Insufficient documentation

## 2018-07-05 DIAGNOSIS — R5383 Other fatigue: Secondary | ICD-10-CM | POA: Insufficient documentation

## 2018-09-18 ENCOUNTER — Ambulatory Visit: Admitting: Adult Health

## 2018-09-18 ENCOUNTER — Encounter: Payer: Self-pay | Admitting: Adult Health

## 2018-09-18 VITALS — BP 123/92 | HR 82 | Temp 97.3°F | Resp 16 | Ht 67.0 in | Wt 163.8 lb

## 2018-09-18 DIAGNOSIS — R030 Elevated blood-pressure reading, without diagnosis of hypertension: Secondary | ICD-10-CM

## 2018-09-18 DIAGNOSIS — R05 Cough: Secondary | ICD-10-CM

## 2018-09-18 DIAGNOSIS — R059 Cough, unspecified: Secondary | ICD-10-CM

## 2018-09-18 DIAGNOSIS — J0110 Acute frontal sinusitis, unspecified: Secondary | ICD-10-CM | POA: Diagnosis not present

## 2018-09-18 MED ORDER — AMOXICILLIN-POT CLAVULANATE 875-125 MG PO TABS: 1.0000 | ORAL_TABLET | Freq: Two times a day (BID) | ORAL | 0 refills | Status: DC

## 2018-09-18 NOTE — Progress Notes (Signed)
[facility] [address] [place], [place] [address]  Internal MEDICINE  Office Visit Note  Patient Name: Roger Mclaughlin  [id number]  [id number]  Date of Service: 09/20/2018  Chief Complaint  Patient presents with  . Nasal Congestion    headache,draining down to chest, phlegm, going on since dec. 23rd  . Cough     HPI Pt is here for a sick visit. Pt reports chest congestion, sinus pain/pressure, cough, PND, sore throat for one week.  He tried taking Mucinex, with no success.       Current Medication:  Outpatient Encounter Medications as of 09/18/2018  Medication Sig  . Acetaminophen-Caffeine (EXCEDRIN ASPIRIN FREE PO) Take by mouth daily.  Marland Kitchen atorvastatin (LIPITOR) 20 MG tablet TAKE 1 TABLET AT BEDTIME  . escitalopram (LEXAPRO) 10 MG tablet Take 1 tablet (10 mg total) by mouth daily.  Marland Kitchen esomeprazole (NEXIUM) 40 MG capsule TAKE 1 CAPSULE DAILY FOR REFLUX  . polyethylene glycol powder (MIRALAX) powder Take 1 Container by mouth every evening.  Marland Kitchen SYMAX DUOTAB 0.375 MG TBCR take 1 tab twice daily  . SYNTHROID 50 MCG tablet TAKE 1 TABLET EVERY MORNING BEFORE BREAKFAST  . amoxicillin-clavulanate (AUGMENTIN) 875-125 MG tablet Take 1 tablet by mouth 2 (two) times daily.  . predniSONE (DELTASONE) 10 MG tablet 2 tabs 2 x day for 2 days (Patient not taking: Reported on 12/25/2017)   No facility-administered encounter medications on file as of 09/18/2018.       Medical History: Past Medical History:  Diagnosis Date  . Chronic sinusitis   . Constipation   . GERD (gastroesophageal reflux disease)   . Hypercholesteremia   . Hypothyroidism   . Irritable bowel syndrome      Vital Signs: BP (!) 123/92   Pulse 82   Temp (!) 97.3 F (36.3 C)   Resp 16   Ht 5\' 7"  (1.702 m)   Wt 163 lb 12.8 oz (74.3 kg)   SpO2 99%   BMI 25.65 kg/m    Review of Systems  Constitutional: Negative.  Negative for chills, fatigue, fever and unexpected weight change.  HENT: Positive for  postnasal drip, sinus pressure, sinus pain and sore throat. Negative for congestion, rhinorrhea and sneezing.   Eyes: Negative for redness.  Respiratory: Positive for cough and chest tightness. Negative for shortness of breath.   Cardiovascular: Negative.  Negative for chest pain and palpitations.  Gastrointestinal: Negative.  Negative for abdominal pain, constipation, diarrhea, nausea and vomiting.  Endocrine: Negative.   Genitourinary: Negative.  Negative for dysuria and frequency.  Musculoskeletal: Negative.  Negative for arthralgias, back pain, joint swelling and neck pain.  Skin: Negative.  Negative for rash.  Allergic/Immunologic: Negative.   Neurological: Negative.  Negative for tremors and numbness.  Hematological: Negative for adenopathy. Does not bruise/bleed easily.  Psychiatric/Behavioral: Negative.  Negative for behavioral problems, sleep disturbance and suicidal ideas. The patient is not nervous/anxious.     Physical Exam Vitals signs and nursing note reviewed.  Constitutional:      General: He is not in acute distress.    Appearance: He is well-developed. He is not diaphoretic.  HENT:     Head: Normocephalic and atraumatic.     Mouth/Throat:     Pharynx: No oropharyngeal exudate.  Eyes:     Pupils: Pupils are equal, round, and reactive to light.  Neck:     Musculoskeletal: Normal range of motion and neck supple.     Thyroid: No thyromegaly.     Vascular:  No JVD.     Trachea: No tracheal deviation.  Cardiovascular:     Rate and Rhythm: Normal rate and regular rhythm.     Heart sounds: Normal heart sounds. No murmur. No friction rub. No gallop.   Pulmonary:     Effort: Pulmonary effort is normal. No respiratory distress.     Breath sounds: Normal breath sounds. No wheezing or rales.  Chest:     Chest wall: No tenderness.  Abdominal:     Palpations: Abdomen is soft.     Tenderness: There is no abdominal tenderness. There is no guarding.  Musculoskeletal: Normal  range of motion.  Lymphadenopathy:     Cervical: No cervical adenopathy.  Skin:    General: Skin is warm and dry.  Neurological:     Mental Status: He is alert and oriented to person, place, and time.     Cranial Nerves: No cranial nerve deficit.  Psychiatric:        Behavior: Behavior normal.        Thought Content: Thought content normal.        Judgment: Judgment normal.    Assessment/Plan: 1. Acute non-recurrent frontal sinusitis Patient provided course of Augmentin.  Instructed patient take all medications as prescribed and to completion.  Patient is to return to clinic in 7 to 10 days if symptoms fail to improve. - amoxicillin-clavulanate (AUGMENTIN) 875-125 MG tablet; Take 1 tablet by mouth 2 (two) times daily.  Dispense: 14 tablet; Refill: 0  2. Cough Patient encouraged to continue to take over-the-counter medications for symptom management.  3. Elevated blood pressure reading Patient's blood pressure elevated today.  Most likely due to over-the-counter decongestants and cough medicine.  Instructed patient to check his blood pressure at home to make sure it normalizes after he quits taking these medications.  General Counseling: Roger Mclaughlin verbalizes understanding of the findings of todays visit and agrees with plan of treatment. I have discussed any further diagnostic evaluation that may be needed or ordered today. We also reviewed his medications today. he has been encouraged to call the office with any questions or concerns that should arise related to todays visit.   No orders of the defined types were placed in this encounter.   Meds ordered this encounter  Medications  . amoxicillin-clavulanate (AUGMENTIN) 875-125 MG tablet    Sig: Take 1 tablet by mouth 2 (two) times daily.    Dispense:  14 tablet    Refill:  0    Time spent: 25 Minutes  This patient was seen by Blima LedgerAdam Jessa Stinson AGNP-C in Collaboration with Dr Lyndon CodeFozia M Khan as a part of collaborative care  agreement.  Johnna AcostaAdam J. Katrinia Straker AGNP-C Internal Medicine

## 2018-09-18 NOTE — Patient Instructions (Signed)

## 2018-10-01 ENCOUNTER — Other Ambulatory Visit: Payer: Self-pay | Admitting: Nurse Practitioner

## 2018-10-02 ENCOUNTER — Ambulatory Visit: Payer: Self-pay | Admitting: Nurse Practitioner

## 2018-10-02 LAB — CBC
HEMOGLOBIN: 14.3 g/dL (ref 13.0–17.7)
Hematocrit: 42.7 % (ref 37.5–51.0)
MCH: 29.3 pg (ref 26.6–33.0)
MCHC: 33.5 g/dL (ref 31.5–35.7)
MCV: 88 fL (ref 79–97)
Platelets: 192 10*3/uL (ref 150–450)
RBC: 4.88 x10E6/uL (ref 4.14–5.80)
RDW: 12.4 % (ref 11.6–15.4)
WBC: 4.3 10*3/uL (ref 3.4–10.8)

## 2018-10-02 LAB — TSH: TSH: 0.772 u[IU]/mL (ref 0.450–4.500)

## 2018-10-02 LAB — T4, FREE: FREE T4: 1.03 ng/dL (ref 0.82–1.77)

## 2018-10-09 ENCOUNTER — Ambulatory Visit (INDEPENDENT_AMBULATORY_CARE_PROVIDER_SITE_OTHER): Admitting: Nurse Practitioner

## 2018-10-09 ENCOUNTER — Encounter: Payer: Self-pay | Admitting: Nurse Practitioner

## 2018-10-09 VITALS — BP 127/85 | HR 88 | Resp 16 | Ht 67.5 in | Wt 163.0 lb

## 2018-10-09 DIAGNOSIS — K219 Gastro-esophageal reflux disease without esophagitis: Secondary | ICD-10-CM | POA: Diagnosis not present

## 2018-10-09 DIAGNOSIS — E039 Hypothyroidism, unspecified: Secondary | ICD-10-CM | POA: Diagnosis not present

## 2018-10-09 DIAGNOSIS — K581 Irritable bowel syndrome with constipation: Secondary | ICD-10-CM

## 2018-10-09 NOTE — Progress Notes (Signed)
[facility] [address] [place], [place] [address]  Internal MEDICINE  Office Visit Note  Patient Name: Roger MorinGarrett R Kunzman  [id number]/2061  [id number]  Date of Service: 10/19/2018  Chief Complaint  Patient presents with  . Hyperlipidemia  . Gastroesophageal Reflux    Has long history of IBS and inflammatory bowel disease. has trouble completely emptying his bowels. Has seen GI providers. Last colonoscopy in 2018 did show diverticulosis and internal, non-bleeding hemorrhoids. It was otherwise normal. Did try low dose lexapro 10mg  to help symptoms of IBS. Was unable to take this as it woke him up every night and kept him from sleeping well. Did not try taking a lower dose. No new concerns or complaints today. Labs reviewed. All normal.       Current Medication: Outpatient Encounter Medications as of 10/09/2018  Medication Sig  . Acetaminophen-Caffeine (EXCEDRIN ASPIRIN FREE PO) Take by mouth daily.  Marland Kitchen. amoxicillin-clavulanate (AUGMENTIN) 875-125 MG tablet Take 1 tablet by mouth 2 (two) times daily.  Marland Kitchen. atorvastatin (LIPITOR) 20 MG tablet TAKE 1 TABLET AT BEDTIME  . escitalopram (LEXAPRO) 10 MG tablet Take 1 tablet (10 mg total) by mouth daily.  Marland Kitchen. esomeprazole (NEXIUM) 40 MG capsule TAKE 1 CAPSULE DAILY FOR REFLUX  . polyethylene glycol powder (MIRALAX) powder Take 1 Container by mouth every evening.  . predniSONE (DELTASONE) 10 MG tablet 2 tabs 2 x day for 2 days (Patient not taking: Reported on 12/25/2017)  . SYMAX DUOTAB 0.375 MG TBCR take 1 tab twice daily  . SYNTHROID 50 MCG tablet TAKE 1 TABLET EVERY MORNING BEFORE BREAKFAST   No facility-administered encounter medications on file as of 10/09/2018.     Surgical History: Past Surgical History:  Procedure Laterality Date  . COLONOSCOPY    . COLONOSCOPY WITH PROPOFOL N/A 01/23/2017   Procedure: COLONOSCOPY WITH PROPOFOL;  Surgeon: Wyline MoodAnna, Kiran, MD;  [facility];  Service: Endoscopy;  Laterality: N/A;  .  ESOPHAGOGASTRODUODENOSCOPY    . WISDOM TOOTH EXTRACTION      Medical History: Past Medical History:  Diagnosis Date  . Chronic sinusitis   . Constipation   . GERD (gastroesophageal reflux disease)   . Hypercholesteremia   . Hypothyroidism   . Irritable bowel syndrome     Family History: Family History  Problem Relation Age of Onset  . Irritable bowel syndrome Mother   . Hypertension Mother   . Diabetes Mother   . Prostate cancer Father   . Hypertension Father   . Diabetes Father   . Irritable bowel syndrome Sister     Social History   Socioeconomic History  . Marital status: Single    Spouse name: Not on file  . Number of children: Not on file  . Years of education: Not on file  . Highest education level: Not on file  Occupational History  . Not on file  Social Needs  . Financial resource strain: Not on file  . Food insecurity:    Worry: Not on file    Inability: Not on file  . Transportation needs:    Medical: Not on file    Non-medical: Not on file  Tobacco Use  . Smoking status: Never Smoker  . Smokeless tobacco: Never Used  Substance and Sexual Activity  . Alcohol use: Yes    Alcohol/week: 7.0 standard drinks    Types: 7 Glasses of wine per week  . Drug use: No  . Sexual activity: Yes    Birth control/protection: Condom  Lifestyle  .  Physical activity:    Days per week: Not on file    Minutes per session: Not on file  . Stress: Not on file  Relationships  . Social connections:    Talks on phone: Not on file    Gets together: Not on file    Attends religious service: Not on file    Active member of club or organization: Not on file    Attends meetings of clubs or organizations: Not on file    Relationship status: Not on file  . Intimate partner violence:    Fear of current or ex partner: Not on file    Emotionally abused: Not on file    Physically abused: Not on file    Forced sexual activity: Not on file  Other Topics Concern  . Not on  file  Social History Narrative  . Not on file      Review of Systems  Constitutional: Negative for appetite change, chills, fatigue and unexpected weight change.  HENT: Negative for congestion, postnasal drip, rhinorrhea, sneezing and sore throat.   Respiratory: Negative for cough, chest tightness and shortness of breath.   Cardiovascular: Negative for chest pain and palpitations.  Gastrointestinal: Positive for abdominal pain, constipation and diarrhea. Negative for nausea and vomiting.       IBS - mostly constipation  Endocrine: Negative for cold intolerance, heat intolerance, polydipsia and polyuria.       Well managed thyroid disease.   Musculoskeletal: Negative for arthralgias, back pain, joint swelling and neck pain.  Skin: Negative for rash.  Allergic/Immunologic: Positive for environmental allergies.  Neurological: Positive for headaches. Negative for tremors and numbness.  Hematological: Negative for adenopathy. Does not bruise/bleed easily.  Psychiatric/Behavioral: Positive for dysphoric mood. Negative for behavioral problems (Depression), sleep disturbance and suicidal ideas. The patient is nervous/anxious.        Patient has noted increased depression and anxiety recently.   Today's Vitals   10/09/18 1510  BP: 127/85  Pulse: 88  Resp: 16  SpO2: 98%  Weight: 163 lb (73.9 kg)  Height: 5' 7.5" (1.715 m)   Body mass index is 25.15 kg/m.  Physical Exam Vitals signs and nursing note reviewed.  Constitutional:      General: He is not in acute distress.    Appearance: Normal appearance. He is well-developed. He is not diaphoretic.  HENT:     Head: Normocephalic and atraumatic.     Nose: Nose normal.     Mouth/Throat:     Pharynx: No oropharyngeal exudate.  Eyes:     Conjunctiva/sclera: Conjunctivae normal.     Pupils: Pupils are equal, round, and reactive to light.  Neck:     Musculoskeletal: Normal range of motion and neck supple.     Thyroid: No thyromegaly.      Vascular: No JVD.     Trachea: No tracheal deviation.  Cardiovascular:     Rate and Rhythm: Normal rate and regular rhythm.     Heart sounds: Normal heart sounds. No murmur. No friction rub. No gallop.   Pulmonary:     Effort: Pulmonary effort is normal. No respiratory distress.     Breath sounds: Normal breath sounds. No wheezing or rales.  Chest:     Chest wall: No tenderness.  Abdominal:     General: Bowel sounds are normal.     Palpations: Abdomen is soft.     Tenderness: There is abdominal tenderness.     Comments: Mild, generalized tenderness present.   Musculoskeletal:  Normal range of motion.  Lymphadenopathy:     Cervical: No cervical adenopathy.  Skin:    General: Skin is warm and dry.     Capillary Refill: Capillary refill takes less than 2 seconds.  Neurological:     Mental Status: He is alert and oriented to person, place, and time.     Cranial Nerves: No cranial nerve deficit.  Psychiatric:        Behavior: Behavior normal.        Thought Content: Thought content normal.        Judgment: Judgment normal.     Assessment/Plan: 1. Irritable bowel syndrome with constipation Patient has had trouble with this most of his adult life. Would like to see new GI provider for further evaluation and treatment.  - Ambulatory referral to Gastroenterology  2. Gastroesophageal reflux disease without esophagitis nexium should be continued as prescribed.   3. Acquired hypothyroidism Recent thyroid panel stable. Continue levothyroxine as prescribed .  General Counseling: Donzetta StarchGarrett verbalizes understanding of the findings of todays visit and agrees with plan of treatment. I have discussed any further diagnostic evaluation that may be needed or ordered today. We also reviewed his medications today. he has been encouraged to call the office with any questions or concerns that should arise related to todays visit.  This patient was seen by Vincent GrosHeather Jaymi Tinner FNP Collaboration with Dr  Lyndon CodeFozia M Khan as a part of collaborative care agreement  Orders Placed This Encounter  Procedures  . Ambulatory referral to Gastroenterology     Time spent: 25 Minutes      Dr Lyndon CodeFozia M Khan Internal medicine

## 2019-03-30 ENCOUNTER — Other Ambulatory Visit: Payer: Self-pay

## 2019-03-30 MED ORDER — LEVOTHYROXINE SODIUM 50 MCG PO TABS: ORAL_TABLET | ORAL | 0 refills | Status: DC

## 2019-03-30 MED ORDER — ATORVASTATIN CALCIUM 20 MG PO TABS: 20.0000 mg | ORAL_TABLET | Freq: Every day | ORAL | 0 refills | Status: DC

## 2019-04-16 ENCOUNTER — Encounter: Payer: Self-pay | Admitting: Nurse Practitioner

## 2019-04-16 ENCOUNTER — Other Ambulatory Visit: Payer: Self-pay

## 2019-04-16 ENCOUNTER — Ambulatory Visit (INDEPENDENT_AMBULATORY_CARE_PROVIDER_SITE_OTHER): Admitting: Nurse Practitioner

## 2019-04-16 VITALS — BP 117/85 | HR 89 | Resp 16 | Ht 67.0 in | Wt 173.4 lb

## 2019-04-16 DIAGNOSIS — K219 Gastro-esophageal reflux disease without esophagitis: Secondary | ICD-10-CM | POA: Diagnosis not present

## 2019-04-16 DIAGNOSIS — E039 Hypothyroidism, unspecified: Secondary | ICD-10-CM | POA: Diagnosis not present

## 2019-04-16 DIAGNOSIS — Z0001 Encounter for general adult medical examination with abnormal findings: Secondary | ICD-10-CM | POA: Diagnosis not present

## 2019-04-16 DIAGNOSIS — R300 Dysuria: Secondary | ICD-10-CM

## 2019-04-16 DIAGNOSIS — K581 Irritable bowel syndrome with constipation: Secondary | ICD-10-CM

## 2019-04-16 NOTE — Progress Notes (Signed)
[facility] [place], [place] [address]  Internal MEDICINE  Office Visit Note  Patient Name: Roger Mclaughlin  [id number]  [id number]  Date of Service: 04/28/2019  Chief Complaint  Patient presents with  . Annual Exam  . Gastroesophageal Reflux  . Hyperlipidemia  . Irritable Bowel Syndrome  . Hypothyroidism     The patient is here for annual wellness visit.  He is currently working from home, as mandated by his employer. The patient had a stand up desk at his office which allowed him to stand most of the day at work. He does not have this at home. Did find a comparable substitute and bought for himself. In order to be reimbursed for this, he has to have a letter from his provider, stating this this is medically necessary. He has significant history of IBS and sitting for long periods of time exacerbates the condition. Makes constipation, gas, and abdominal pain much worse.  He has seen a new gastroenterologist. He was started on new medication and also started magnesium every night. This combination has helped to control the IBS more than before.   Pt is here for routine health maintenance examination  Current Medication: Outpatient Encounter Medications as of 04/16/2019  Medication Sig  . Acetaminophen-Caffeine (EXCEDRIN ASPIRIN FREE PO) Take by mouth daily.  Marland Kitchen amoxicillin-clavulanate (AUGMENTIN) 875-125 MG tablet Take 1 tablet by mouth 2 (two) times daily.  Marland Kitchen atorvastatin (LIPITOR) 20 MG tablet Take 1 tablet (20 mg total) by mouth at bedtime.  . clidinium-chlordiazePOXIDE (LIBRAX) 5-2.5 MG capsule Take 1 capsule by mouth 3 (three) times daily before meals.  Marland Kitchen esomeprazole (NEXIUM) 40 MG capsule Take 40 mg by mouth daily at 12 noon.  Marland Kitchen levothyroxine (SYNTHROID) 50 MCG tablet TAKE 1 TABLET EVERY MORNING BEFORE BREAKFAST  . Magnesium 500 MG TABS Take 2 tablets by mouth daily.  . polyethylene glycol powder (MIRALAX) powder Take 1 Container by mouth every  evening.  . [DISCONTINUED] escitalopram (LEXAPRO) 10 MG tablet Take 1 tablet (10 mg total) by mouth daily. (Patient not taking: Reported on 04/16/2019)  . [DISCONTINUED] esomeprazole (NEXIUM) 40 MG capsule TAKE 1 CAPSULE DAILY FOR REFLUX (Patient not taking: TAKE 1 CAPSULE DAILY FOR REFLUX)  . [DISCONTINUED] predniSONE (DELTASONE) 10 MG tablet 2 tabs 2 x day for 2 days (Patient not taking: Reported on 04/16/2019)  . [DISCONTINUED] SYMAX DUOTAB 0.375 MG TBCR take 1 tab twice daily (Patient not taking: Reported on 04/16/2019)   No facility-administered encounter medications on file as of 04/16/2019.     Surgical History: Past Surgical History:  Procedure Laterality Date  . COLONOSCOPY    . COLONOSCOPY WITH PROPOFOL N/A 01/23/2017   Procedure: COLONOSCOPY WITH PROPOFOL;  Surgeon: Jonathon Bellows, MD;  Location: [facility] ENDOSCOPY;  Service: Endoscopy;  Laterality: N/A;  . ESOPHAGOGASTRODUODENOSCOPY    . WISDOM TOOTH EXTRACTION      Medical History: Past Medical History:  Diagnosis Date  . Chronic sinusitis   . Constipation   . GERD (gastroesophageal reflux disease)   . Hypercholesteremia   . Hypothyroidism   . Irritable bowel syndrome     Family History: Family History  Problem Relation Age of Onset  . Irritable bowel syndrome Mother   . Hypertension Mother   . Diabetes Mother   . Prostate cancer Father   . Hypertension Father   . Diabetes Father   . Irritable bowel syndrome Sister       Review of Systems  Constitutional: Negative for appetite change,  chills, fatigue and unexpected weight change.  HENT: Negative for congestion, postnasal drip, rhinorrhea, sneezing and sore throat.   Respiratory: Negative for cough, chest tightness and shortness of breath.   Cardiovascular: Negative for chest pain and palpitations.  Gastrointestinal: Positive for abdominal pain, constipation and diarrhea. Negative for nausea and vomiting.       IBS - mostly constipation  Endocrine: Negative for cold  intolerance, heat intolerance, polydipsia and polyuria.       Well managed thyroid disease.   Musculoskeletal: Negative for arthralgias, back pain, joint swelling and neck pain.  Skin: Negative for rash.  Allergic/Immunologic: Positive for environmental allergies.  Neurological: Positive for headaches. Negative for tremors and numbness.  Hematological: Negative for adenopathy. Does not bruise/bleed easily.  Psychiatric/Behavioral: Positive for dysphoric mood. Negative for behavioral problems (Depression), sleep disturbance and suicidal ideas. The patient is nervous/anxious.        Patient has noted increased depression and anxiety recently.     Today's Vitals   04/16/19 1435  BP: 117/85  Pulse: 89  Resp: 16  SpO2: 92%  Weight: 173 lb 6.4 oz (78.7 kg)  Height: 5\' 7"  (1.702 m)   Body mass index is 27.16 kg/m.  Physical Exam Vitals signs and nursing note reviewed.  Constitutional:      General: He is not in acute distress.    Appearance: Normal appearance. He is well-developed. He is not diaphoretic.  HENT:     Head: Normocephalic and atraumatic.     Nose: Nose normal.     Mouth/Throat:     Pharynx: No oropharyngeal exudate.  Eyes:     Conjunctiva/sclera: Conjunctivae normal.     Pupils: Pupils are equal, round, and reactive to light.  Neck:     Musculoskeletal: Normal range of motion and neck supple.     Thyroid: No thyromegaly.     Vascular: No JVD.     Trachea: No tracheal deviation.  Cardiovascular:     Rate and Rhythm: Normal rate and regular rhythm.     Pulses: Normal pulses.     Heart sounds: Normal heart sounds. No murmur. No friction rub. No gallop.   Pulmonary:     Effort: Pulmonary effort is normal. No respiratory distress.     Breath sounds: Normal breath sounds. No wheezing or rales.  Chest:     Chest wall: No tenderness.  Abdominal:     General: Bowel sounds are normal.     Palpations: Abdomen is soft.     Tenderness: There is no abdominal tenderness.   Musculoskeletal: Normal range of motion.  Lymphadenopathy:     Cervical: No cervical adenopathy.  Skin:    General: Skin is warm and dry.     Capillary Refill: Capillary refill takes less than 2 seconds.  Neurological:     Mental Status: He is alert and oriented to person, place, and time.     Cranial Nerves: No cranial nerve deficit.  Psychiatric:        Behavior: Behavior normal.        Thought Content: Thought content normal.        Judgment: Judgment normal.   Assessment/Plan: 1. Encounter for general adult medical examination with abnormal findings Annual health maintenance exam today  2. Irritable bowel syndrome with constipation Improved. New meds started per new GI provider helping. Routine follow ups as scheduled. Will get note for patient to have purchase of stand-up desk for home use written and returned to the patient.   3.  Acquired hypothyroidism Stable. Continue levothyroxine as prescribed   4. Gastroesophageal reflux disease without esophagitis Continue nexium as prescribed   5. Dysuria - Urinalysis, Routine w reflex microscopic  General Counseling: Donzetta StarchGarrett verbalizes understanding of the findings of todays visit and agrees with plan of treatment. I have discussed any further diagnostic evaluation that may be needed or ordered today. We also reviewed his medications today. he has been encouraged to call the office with any questions or concerns that should arise related to todays visit.    Counseling:  This patient was seen by Vincent GrosHeather Gertrude Tarbet FNP Collaboration with Dr Lyndon CodeFozia M Khan as a part of collaborative care agreement  Orders Placed This Encounter  Procedures  . Urinalysis, Routine w reflex microscopic      Time spent: 4945 Minutes      Lyndon CodeFozia M Khan, MD  Internal Medicine

## 2019-04-17 LAB — URINALYSIS, ROUTINE W REFLEX MICROSCOPIC
Bilirubin, UA: NEGATIVE
Glucose, UA: NEGATIVE
Ketones, UA: NEGATIVE
Leukocytes,UA: NEGATIVE
Nitrite, UA: NEGATIVE
Protein,UA: NEGATIVE
RBC, UA: NEGATIVE
Specific Gravity, UA: 1.01 (ref 1.005–1.030)
Urobilinogen, Ur: 0.2 mg/dL (ref 0.2–1.0)
pH, UA: 6.5 (ref 5.0–7.5)

## 2019-06-18 ENCOUNTER — Encounter: Payer: Self-pay | Admitting: Nurse Practitioner

## 2019-06-22 ENCOUNTER — Other Ambulatory Visit: Payer: Self-pay

## 2019-06-22 MED ORDER — ESOMEPRAZOLE MAGNESIUM 40 MG PO CPDR: 40.0000 mg | DELAYED_RELEASE_CAPSULE | Freq: Every day | ORAL | 1 refills | Status: DC

## 2019-06-22 MED ORDER — ATORVASTATIN CALCIUM 20 MG PO TABS: 20.0000 mg | ORAL_TABLET | Freq: Every day | ORAL | 0 refills | Status: DC

## 2019-06-22 MED ORDER — LEVOTHYROXINE SODIUM 50 MCG PO TABS: ORAL_TABLET | ORAL | 1 refills | Status: DC

## 2019-07-09 ENCOUNTER — Other Ambulatory Visit: Payer: Self-pay | Admitting: Nurse Practitioner

## 2019-07-10 LAB — COMPREHENSIVE METABOLIC PANEL
ALT: 69 IU/L — ABNORMAL HIGH (ref 0–44)
AST: 42 IU/L — ABNORMAL HIGH (ref 0–40)
Albumin/Globulin Ratio: 1.7 (ref 1.2–2.2)
Albumin: 4.2 g/dL (ref 3.8–4.9)
Alkaline Phosphatase: 84 IU/L (ref 39–117)
BUN/Creatinine Ratio: 12 (ref 9–20)
BUN: 16 mg/dL (ref 6–24)
Bilirubin Total: 0.3 mg/dL (ref 0.0–1.2)
CO2: 22 mmol/L (ref 20–29)
Calcium: 9.6 mg/dL (ref 8.7–10.2)
Chloride: 102 mmol/L (ref 96–106)
Creatinine, Ser: 1.36 mg/dL — ABNORMAL HIGH (ref 0.76–1.27)
GFR calc Af Amer: 66 mL/min/{1.73_m2} (ref >59)
GFR calc non Af Amer: 57 mL/min/{1.73_m2} — ABNORMAL LOW (ref >59)
Globulin, Total: 2.5 g/dL (ref 1.5–4.5)
Glucose: 92 mg/dL (ref 65–99)
Potassium: 4.3 mmol/L (ref 3.5–5.2)
Sodium: 136 mmol/L (ref 134–144)
Total Protein: 6.7 g/dL (ref 6.0–8.5)

## 2019-07-10 LAB — CBC
Hematocrit: 43 % (ref 37.5–51.0)
Hemoglobin: 14.6 g/dL (ref 13.0–17.7)
MCH: 29.8 pg (ref 26.6–33.0)
MCHC: 34 g/dL (ref 31.5–35.7)
MCV: 88 fL (ref 79–97)
Platelets: 154 10*3/uL (ref 150–450)
RBC: 4.9 x10E6/uL (ref 4.14–5.80)
RDW: 12.8 % (ref 11.6–15.4)
WBC: 3.4 10*3/uL (ref 3.4–10.8)

## 2019-07-10 LAB — PSA: Prostate Specific Ag, Serum: 0.8 ng/mL (ref 0.0–4.0)

## 2019-07-10 LAB — T4, FREE: Free T4: 1.04 ng/dL (ref 0.82–1.77)

## 2019-07-10 LAB — LIPID PANEL W/O CHOL/HDL RATIO
Cholesterol, Total: 177 mg/dL (ref 100–199)
HDL: 60 mg/dL (ref >39)
LDL Chol Calc (NIH): 100 mg/dL — ABNORMAL HIGH (ref 0–99)
Triglycerides: 91 mg/dL (ref 0–149)
VLDL Cholesterol Cal: 17 mg/dL (ref 5–40)

## 2019-07-10 LAB — TSH: TSH: 1.37 u[IU]/mL (ref 0.450–4.500)

## 2019-07-22 ENCOUNTER — Telehealth: Payer: Self-pay

## 2019-07-22 NOTE — Progress Notes (Signed)
Please let the patient know that recent labs showed mild elevation of both liver and kidney functions. He should make sure he is drinking plenty of water. Would like to have these levels rechecked. Will mail him out a lab slip. He does not have to be fasting and can do them anytime. All other labs were good. Thanks.

## 2019-07-22 NOTE — Telephone Encounter (Signed)
Called pt to notify him of lab results.

## 2019-07-23 ENCOUNTER — Telehealth: Payer: Self-pay

## 2019-07-23 NOTE — Telephone Encounter (Signed)
Pt was advised of labs and rechecking labs due to elevated kidney and liver functions.

## 2019-07-23 NOTE — Telephone Encounter (Signed)
-----   Message from Ronnell Freshwater, NP sent at 07/22/2019  9:07 AM EST ----- Please let the patient know that recent labs showed mild elevation of both liver and kidney functions. He should make sure he is drinking plenty of water. Would like to have these levels rechecked. Will mail him out a lab slip. He does not have to be  fasting and can do them anytime. All other labs were good. Thanks.

## 2019-09-06 ENCOUNTER — Other Ambulatory Visit: Payer: Self-pay | Admitting: Nurse Practitioner

## 2019-09-07 LAB — COMPREHENSIVE METABOLIC PANEL
ALT: 51 IU/L — ABNORMAL HIGH (ref 0–44)
AST: 28 IU/L (ref 0–40)
Albumin/Globulin Ratio: 1.8 (ref 1.2–2.2)
Albumin: 4.4 g/dL (ref 3.8–4.9)
Alkaline Phosphatase: 89 IU/L (ref 39–117)
BUN/Creatinine Ratio: 10 (ref 9–20)
BUN: 14 mg/dL (ref 6–24)
Bilirubin Total: 0.4 mg/dL (ref 0.0–1.2)
CO2: 20 mmol/L (ref 20–29)
Calcium: 9.7 mg/dL (ref 8.7–10.2)
Chloride: 101 mmol/L (ref 96–106)
Creatinine, Ser: 1.37 mg/dL — ABNORMAL HIGH (ref 0.76–1.27)
GFR calc Af Amer: 66 mL/min/{1.73_m2} (ref >59)
GFR calc non Af Amer: 57 mL/min/{1.73_m2} — ABNORMAL LOW (ref >59)
Globulin, Total: 2.5 g/dL (ref 1.5–4.5)
Glucose: 94 mg/dL (ref 65–99)
Potassium: 4.2 mmol/L (ref 3.5–5.2)
Sodium: 140 mmol/L (ref 134–144)
Total Protein: 6.9 g/dL (ref 6.0–8.5)

## 2019-09-07 LAB — HEPATITIS PANEL, ACUTE
Hep A IgM: NEGATIVE
Hep B C IgM: NEGATIVE
Hep C Virus Ab: <0.1 s/co ratio (ref 0.0–0.9)
Hepatitis B Surface Ag: NEGATIVE

## 2019-09-13 NOTE — Progress Notes (Signed)
Improved labs. Discuss at visit 10/18/2019

## 2019-10-04 ENCOUNTER — Other Ambulatory Visit: Payer: Self-pay

## 2019-10-04 MED ORDER — ATORVASTATIN CALCIUM 20 MG PO TABS: 20.0000 mg | ORAL_TABLET | Freq: Every day | ORAL | 0 refills | Status: DC

## 2019-10-15 ENCOUNTER — Telehealth: Payer: Self-pay

## 2019-10-15 NOTE — Telephone Encounter (Signed)
Patient rescheduled appointment on 10/18/2019 to 10/26/2019. klh

## 2019-10-18 ENCOUNTER — Ambulatory Visit: Admitting: Nurse Practitioner

## 2019-10-26 ENCOUNTER — Encounter: Payer: Self-pay | Admitting: Nurse Practitioner

## 2019-10-26 ENCOUNTER — Ambulatory Visit (INDEPENDENT_AMBULATORY_CARE_PROVIDER_SITE_OTHER): Admitting: Nurse Practitioner

## 2019-10-26 VITALS — Ht 67.5 in | Wt 158.0 lb

## 2019-10-26 DIAGNOSIS — R7989 Other specified abnormal findings of blood chemistry: Secondary | ICD-10-CM | POA: Diagnosis not present

## 2019-10-26 DIAGNOSIS — E039 Hypothyroidism, unspecified: Secondary | ICD-10-CM | POA: Diagnosis not present

## 2019-10-26 DIAGNOSIS — K581 Irritable bowel syndrome with constipation: Secondary | ICD-10-CM | POA: Diagnosis not present

## 2019-10-26 NOTE — Progress Notes (Signed)
[facility] [place], [place] [address]  Internal MEDICINE  Telephone Visit  Patient Name: Roger Mclaughlin  [id number]  [id number]  Date of Service: 10/26/2019  I connected with the patient at 10:07am by webcam and verified the patients identity using two identifiers.   I discussed the limitations, risks, security and privacy concerns of performing an evaluation and management service by webcam and the availability of in person appointments. I also discussed with the patient that there may be a patient responsible charge related to the service.  The patient expressed understanding and agrees to proceed.    Chief Complaint  Patient presents with  . Telephone Assessment  . Telephone Screen  . Irritable Bowel Syndrome  . Hyperlipidemia  . Gastroesophageal Reflux  . Hypothyroidism    The patient has been contacted via webcam for follow up visit due to concerns for spread of novel coronavirus. The patient presents for routine visit. The patient recently had recheck of labs. His liver functions had been elevated as had the renal functions.  Recheck indicated that liver functions returning to normal. Hepatitis screen was normal. His renal functions were stable with elevated creatinine level, normal Bun. EGFR is normal for african [place] male.  He continues to have issues with irritable bowel. States that GI doctor has started him on magnesium supplement. He states that he is having better, more complete bowel movements. Issues still exist. He has no new concerns or complaints today.       Current Medication: Outpatient Encounter Medications as of 10/26/2019  Medication Sig  . Acetaminophen-Caffeine (EXCEDRIN ASPIRIN FREE PO) Take by mouth daily.  Marland Kitchen atorvastatin (LIPITOR) 20 MG tablet Take 1 tablet (20 mg total) by mouth at bedtime.  . clidinium-chlordiazePOXIDE (LIBRAX) 5-2.5 MG capsule Take 1 capsule by mouth 3 (three) times daily before meals.  Marland Kitchen esomeprazole  (NEXIUM) 40 MG capsule Take 1 capsule (40 mg total) by mouth daily at 12 noon.  Marland Kitchen levothyroxine (SYNTHROID) 50 MCG tablet TAKE 1 TABLET EVERY MORNING BEFORE BREAKFAST  . Magnesium 500 MG TABS Take 2 tablets by mouth daily.  . polyethylene glycol powder (MIRALAX) powder Take 1 Container by mouth every evening.  . [DISCONTINUED] amoxicillin-clavulanate (AUGMENTIN) 875-125 MG tablet Take 1 tablet by mouth 2 (two) times daily. (Patient not taking: Reported on 10/26/2019)   No facility-administered encounter medications on file as of 10/26/2019.    Surgical History: Past Surgical History:  Procedure Laterality Date  . COLONOSCOPY    . COLONOSCOPY WITH PROPOFOL N/A 01/23/2017   Procedure: COLONOSCOPY WITH PROPOFOL;  Surgeon: Jonathon Bellows, MD;  Location: [facility] ENDOSCOPY;  Service: Endoscopy;  Laterality: N/A;  . ESOPHAGOGASTRODUODENOSCOPY    . WISDOM TOOTH EXTRACTION      Medical History: Past Medical History:  Diagnosis Date  . Chronic sinusitis   . Constipation   . GERD (gastroesophageal reflux disease)   . Hypercholesteremia   . Hypothyroidism   . Irritable bowel syndrome     Family History: Family History  Problem Relation Age of Onset  . Irritable bowel syndrome Mother   . Hypertension Mother   . Diabetes Mother   . Prostate cancer Father   . Hypertension Father   . Diabetes Father   . Irritable bowel syndrome Sister     Social History   Socioeconomic History  . Marital status: Single    Spouse name: Not on file  . Number of children: Not on file  . Years of education: Not on file  .  Highest education level: Not on file  Occupational History  . Not on file  Tobacco Use  . Smoking status: Never Smoker  . Smokeless tobacco: Never Used  Substance and Sexual Activity  . Alcohol use: Yes    Alcohol/week: 7.0 standard drinks    Types: 7 Glasses of wine per week  . Drug use: No  . Sexual activity: Yes    Birth control/protection: Condom  Other Topics Concern  . Not on  file  Social History Narrative  . Not on file   Social Determinants of Health   Financial Resource Strain:   . Difficulty of Paying Living Expenses: Not on file  Food Insecurity:   . Worried About Charity fundraiser in the Last Year: Not on file  . Ran Out of Food in the Last Year: Not on file  Transportation Needs:   . Lack of Transportation (Medical): Not on file  . Lack of Transportation (Non-Medical): Not on file  Physical Activity:   . Days of Exercise per Week: Not on file  . Minutes of Exercise per Session: Not on file  Stress:   . Feeling of Stress : Not on file  Social Connections:   . Frequency of Communication with Friends and Family: Not on file  . Frequency of Social Gatherings with Friends and Family: Not on file  . Attends Religious Services: Not on file  . Active Member of Clubs or Organizations: Not on file  . Attends Archivist Meetings: Not on file  . Marital Status: Not on file  Intimate Partner Violence:   . Fear of Current or Ex-Partner: Not on file  . Emotionally Abused: Not on file  . Physically Abused: Not on file  . Sexually Abused: Not on file      Review of Systems  Constitutional: Negative for appetite change, chills, fatigue and unexpected weight change.  HENT: Negative for congestion, postnasal drip, rhinorrhea, sneezing and sore throat.   Respiratory: Negative for cough, chest tightness and shortness of breath.   Cardiovascular: Negative for chest pain and palpitations.  Gastrointestinal: Positive for abdominal pain, constipation and diarrhea. Negative for nausea and vomiting.       IBS - mostly constipation. States that condition is improved since adding magnesium supplement.   Endocrine: Negative for cold intolerance, heat intolerance, polydipsia and polyuria.       Well managed thyroid disease.   Musculoskeletal: Negative for arthralgias, back pain, joint swelling and neck pain.  Skin: Negative for rash.   Allergic/Immunologic: Positive for environmental allergies.  Neurological: Positive for headaches. Negative for tremors and numbness.  Hematological: Negative for adenopathy. Does not bruise/bleed easily.  Psychiatric/Behavioral: Positive for dysphoric mood. Negative for behavioral problems (Depression), sleep disturbance and suicidal ideas. The patient is nervous/anxious.        Patient has noted increased depression and anxiety recently.    Today's Vitals   10/26/19 0925  Weight: 158 lb (71.7 kg)  Height: 5' 7.5" (1.715 m)   Body mass index is 24.38 kg/m.  Observation/Objective:  The patient is alert and oriented. He is pleasant and answering all questions appropriately. Breathing is non-labored. He is in no acute distress.    Assessment/Plan: 1. Irritable bowel syndrome with constipation stabl with some improvement. Continue routine visits with GI provider as scheduled   2. Acquired hypothyroidism Stable. Continue levothyroxine as prescribed   3. Elevated serum creatinine Stable. Normal BUN and eGFR. Reviewed prior ultrasound which showed normal structure of kidneys.  4. Elevated liver function tests Improving. Negative hepatitis screen. Reviewed prior ultrasound which showed no problems with liver.   General Counseling: merric yost understanding of the findings of today's phone visit and agrees with plan of treatment. I have discussed any further diagnostic evaluation that may be needed or ordered today. We also reviewed his medications today. he has been encouraged to call the office with any questions or concerns that should arise related to todays visit.   This patient was seen by Leretha Pol FNP Collaboration with Dr Lavera Guise as a part of collaborative care agreement   Time spent: 20 Minutes  Time spent with patient included reviewing progress notes, labs, imaging studies, and discussing plan for follow up.   Dr Lavera Guise Internal medicine

## 2019-11-16 ENCOUNTER — Other Ambulatory Visit: Payer: Self-pay

## 2019-11-16 MED ORDER — ESOMEPRAZOLE MAGNESIUM 40 MG PO CPDR: 40.0000 mg | DELAYED_RELEASE_CAPSULE | Freq: Every day | ORAL | 1 refills | Status: DC

## 2019-11-16 MED ORDER — LEVOTHYROXINE SODIUM 50 MCG PO TABS: ORAL_TABLET | ORAL | 1 refills | Status: DC

## 2019-12-20 ENCOUNTER — Other Ambulatory Visit: Payer: Self-pay

## 2019-12-20 MED ORDER — ATORVASTATIN CALCIUM 20 MG PO TABS: 20.0000 mg | ORAL_TABLET | Freq: Every day | ORAL | 0 refills | Status: DC

## 2020-02-28 ENCOUNTER — Other Ambulatory Visit: Payer: Self-pay

## 2020-02-28 MED ORDER — ATORVASTATIN CALCIUM 20 MG PO TABS: 20.0000 mg | ORAL_TABLET | Freq: Every day | ORAL | 0 refills | Status: DC

## 2020-04-04 ENCOUNTER — Other Ambulatory Visit: Payer: Self-pay

## 2020-04-04 MED ORDER — LEVOTHYROXINE SODIUM 50 MCG PO TABS: ORAL_TABLET | ORAL | 1 refills | Status: DC

## 2020-04-20 ENCOUNTER — Telehealth: Payer: Self-pay

## 2020-04-20 NOTE — Telephone Encounter (Signed)
Confirmed and screened for office visit 8/9 

## 2020-04-24 ENCOUNTER — Encounter: Payer: Self-pay | Admitting: Nurse Practitioner

## 2020-04-24 ENCOUNTER — Ambulatory Visit (INDEPENDENT_AMBULATORY_CARE_PROVIDER_SITE_OTHER): Admitting: Hospice and Palliative Medicine

## 2020-04-24 ENCOUNTER — Other Ambulatory Visit: Payer: Self-pay

## 2020-04-24 DIAGNOSIS — J329 Chronic sinusitis, unspecified: Secondary | ICD-10-CM

## 2020-04-24 DIAGNOSIS — K581 Irritable bowel syndrome with constipation: Secondary | ICD-10-CM

## 2020-04-24 DIAGNOSIS — K219 Gastro-esophageal reflux disease without esophagitis: Secondary | ICD-10-CM | POA: Diagnosis not present

## 2020-04-24 DIAGNOSIS — Z0000 Encounter for general adult medical examination without abnormal findings: Secondary | ICD-10-CM

## 2020-04-24 DIAGNOSIS — N1831 Chronic kidney disease, stage 3a: Secondary | ICD-10-CM

## 2020-04-24 NOTE — Progress Notes (Signed)
[facility] [address] [place], [place] [address]  Internal MEDICINE  Office Visit Note  Patient Name: Roger Mclaughlin  [id number]  [id number]  Date of Service: 04/24/2020  Chief Complaint  Patient presents with  . Annual Exam  . Gastroesophageal Reflux  . Hyperlipidemia  . Quality Metric Gaps    covid vaccine,tetnaus   HPI Pt is here for routine health maintenance examination. Overall he says his health has been good, no changes in his medical history or medications since last visit. Has several chronic medical conditions he has battles since his early 20's. Is seen and followed by a few specialists for his chronic conditions. Has a headache every morning when waking up from chronic sinusitis. Has taken Excedrin daily for many years for headaches. Excedrin continues to provide relief from his headaches. Has been seen by ENT and sinus CT scans have been completed. Has tried allergy shots in the past but had severe reactions from them and has not tried them since.  Librax for IBS-constipation also magnesium as well as miralax. Has a bowel movement in the morning, does not feel as though he completes his bowel movement. Is followed routinely by GI. Denies any chest pain, shortness of breath, visual disturbances or palpitations. Up to date on colonoscopy at this time.  Current Medication: Outpatient Encounter Medications as of 04/24/2020  Medication Sig  . Acetaminophen-Caffeine (EXCEDRIN ASPIRIN FREE PO) Take by mouth daily.  Marland Kitchen atorvastatin (LIPITOR) 20 MG tablet Take 1 tablet (20 mg total) by mouth at bedtime.  . clidinium-chlordiazePOXIDE (LIBRAX) 5-2.5 MG capsule Take 1 capsule by mouth 3 (three) times daily before meals.  Marland Kitchen esomeprazole (NEXIUM) 40 MG capsule Take 1 capsule (40 mg total) by mouth daily at 12 noon.  Marland Kitchen levothyroxine (SYNTHROID) 50 MCG tablet TAKE 1 TABLET EVERY MORNING BEFORE BREAKFAST  . Magnesium 500 MG TABS Take 2 tablets by mouth daily.  .  polyethylene glycol powder (MIRALAX) powder Take 1 Container by mouth every evening.   No facility-administered encounter medications on file as of 04/24/2020.    Surgical History: Past Surgical History:  Procedure Laterality Date  . COLONOSCOPY    . COLONOSCOPY WITH PROPOFOL N/A 01/23/2017   Procedure: COLONOSCOPY WITH PROPOFOL;  Surgeon: Wyline Mood, MD;  Location: [facility] ENDOSCOPY;  Service: Endoscopy;  Laterality: N/A;  . ESOPHAGOGASTRODUODENOSCOPY    . WISDOM TOOTH EXTRACTION      Medical History: Past Medical History:  Diagnosis Date  . Chronic sinusitis   . Constipation   . GERD (gastroesophageal reflux disease)   . Hypercholesteremia   . Hypothyroidism   . Irritable bowel syndrome     Family History: Family History  Problem Relation Age of Onset  . Irritable bowel syndrome Mother   . Hypertension Mother   . Diabetes Mother   . Prostate cancer Father   . Hypertension Father   . Diabetes Father   . Irritable bowel syndrome Sister     Review of Systems  Constitutional: Negative for activity change, appetite change, fatigue and unexpected weight change.  HENT: Negative for congestion, rhinorrhea, sneezing and sore throat.   Eyes: Negative for photophobia and visual disturbance.  Respiratory: Negative for cough, chest tightness, shortness of breath and wheezing.   Cardiovascular: Negative for chest pain, palpitations and leg swelling.  Gastrointestinal: Negative for abdominal pain, blood in stool, diarrhea, nausea and vomiting.  Genitourinary: Negative for dysuria and frequency.  Musculoskeletal: Negative for arthralgias, back pain, joint swelling and neck pain.  Skin: Negative  for color change and rash.  Neurological: Negative.  Negative for tremors, weakness and numbness.  Hematological: Negative for adenopathy. Does not bruise/bleed easily.  Psychiatric/Behavioral: Negative for behavioral problems (Depression), sleep disturbance and suicidal ideas. The patient is  not nervous/anxious.      Vital Signs: BP 125/90   Pulse 82   Temp 97.6 F (36.4 C)   Resp 16   Ht 5' 7.5" (1.715 m)   Wt 172 lb (78 kg)   SpO2 98%   BMI 26.54 kg/m    Physical Exam Vitals reviewed.  Constitutional:      Appearance: He is normal weight.  HENT:     Mouth/Throat:     Mouth: Mucous membranes are moist.     Pharynx: Oropharynx is clear.  Cardiovascular:     Rate and Rhythm: Normal rate and regular rhythm.     Pulses: Normal pulses.     Heart sounds: Normal heart sounds.  Pulmonary:     Effort: Pulmonary effort is normal.     Breath sounds: Normal breath sounds.  Abdominal:     General: Abdomen is flat. Bowel sounds are normal.     Palpations: Abdomen is soft.  Musculoskeletal:        General: Normal range of motion.     Cervical back: Normal range of motion.  Skin:    General: Skin is warm.  Neurological:     General: No focal deficit present.     Mental Status: He is alert and oriented to person, place, and time. Mental status is at baseline.  Psychiatric:        Mood and Affect: Mood normal.        Behavior: Behavior normal.        Thought Content: Thought content normal.    LABS: No results found for this or any previous visit (from the past 2160 hour(s)).  Assessment/Plan: 1. Encounter for health maintenance examination in adult Well appearing 59 year old male. Up to date on PHM. - CBC with Differential/Platelet - Lipid Panel With LDL/HDL Ratio - TSH - T4, free - Comprehensive metabolic panel - PSA  2. Irritable bowel syndrome with constipation Chronic issue with constipation. Feels as though he is being managed well on his current medications. Continue to follow-up with GI.  3. Chronic sinusitis, unspecified location Has had sinus headaches everyday upon awakening for many years. Has taken Excedrin for headaches with relief. Has been seen in the past by ENT.  4. Gastroesophageal reflux disease without esophagitis Stable at this  time, continue with current therapy.  5. Stage 3a chronic kidney disease - recheck kidney functions   General Counseling: akito boomhower understanding of the findings of todays visit and agrees with plan of treatment. I have discussed any further diagnostic evaluation that may be needed or ordered today. We also reviewed his medications today. he has been encouraged to call the office with any questions or concerns that should arise related to todays visit.   Orders Placed This Encounter  Procedures  . CBC with Differential/Platelet  . Lipid Panel With LDL/HDL Ratio  . TSH  . T4, free  . Comprehensive metabolic panel  . PSA    No orders of the defined types were placed in this encounter.   Total time spent: 30 Minutes  Time spent includes review of chart, medications, test results, and follow up plan with the patient.   This patient was seen by Brent General AGNP-C Collaboration with Dr Lyndon Code as  a part of collaborative care agreement   Tanna Furry. [facility] Internal Medicine

## 2020-04-25 LAB — COMPREHENSIVE METABOLIC PANEL
ALT: 56 IU/L — ABNORMAL HIGH (ref 0–44)
AST: 40 IU/L (ref 0–40)
Albumin/Globulin Ratio: 2 (ref 1.2–2.2)
Albumin: 4.9 g/dL (ref 3.8–4.9)
Alkaline Phosphatase: 94 IU/L (ref 48–121)
BUN/Creatinine Ratio: 10 (ref 9–20)
BUN: 15 mg/dL (ref 6–24)
Bilirubin Total: 0.3 mg/dL (ref 0.0–1.2)
CO2: 21 mmol/L (ref 20–29)
Calcium: 9.9 mg/dL (ref 8.7–10.2)
Chloride: 101 mmol/L (ref 96–106)
Creatinine, Ser: 1.52 mg/dL — ABNORMAL HIGH (ref 0.76–1.27)
GFR calc Af Amer: 58 mL/min/{1.73_m2} — ABNORMAL LOW (ref >59)
GFR calc non Af Amer: 50 mL/min/{1.73_m2} — ABNORMAL LOW (ref >59)
Globulin, Total: 2.5 g/dL (ref 1.5–4.5)
Glucose: 94 mg/dL (ref 65–99)
Potassium: 4.3 mmol/L (ref 3.5–5.2)
Sodium: 138 mmol/L (ref 134–144)
Total Protein: 7.4 g/dL (ref 6.0–8.5)

## 2020-04-25 LAB — LIPID PANEL WITH LDL/HDL RATIO
Cholesterol, Total: 198 mg/dL (ref 100–199)
HDL: 65 mg/dL (ref >39)
LDL Chol Calc (NIH): 112 mg/dL — ABNORMAL HIGH (ref 0–99)
LDL/HDL Ratio: 1.7 ratio (ref 0.0–3.6)
Triglycerides: 120 mg/dL (ref 0–149)
VLDL Cholesterol Cal: 21 mg/dL (ref 5–40)

## 2020-04-25 LAB — CBC WITH DIFFERENTIAL/PLATELET
Basophils Absolute: 0 10*3/uL (ref 0.0–0.2)
Basos: 1 %
EOS (ABSOLUTE): 0.2 10*3/uL (ref 0.0–0.4)
Eos: 4 %
Hematocrit: 45.5 % (ref 37.5–51.0)
Hemoglobin: 15.4 g/dL (ref 13.0–17.7)
Immature Grans (Abs): 0 10*3/uL (ref 0.0–0.1)
Immature Granulocytes: 0 %
Lymphocytes Absolute: 2 10*3/uL (ref 0.7–3.1)
Lymphs: 47 %
MCH: 29.6 pg (ref 26.6–33.0)
MCHC: 33.8 g/dL (ref 31.5–35.7)
MCV: 88 fL (ref 79–97)
Monocytes Absolute: 0.4 10*3/uL (ref 0.1–0.9)
Monocytes: 9 %
Neutrophils Absolute: 1.7 10*3/uL (ref 1.4–7.0)
Neutrophils: 39 %
Platelets: 164 10*3/uL (ref 150–450)
RBC: 5.2 x10E6/uL (ref 4.14–5.80)
RDW: 11.9 % (ref 11.6–15.4)
WBC: 4.2 10*3/uL (ref 3.4–10.8)

## 2020-04-25 LAB — T4, FREE: Free T4: 0.99 ng/dL (ref 0.82–1.77)

## 2020-04-25 LAB — TSH: TSH: 1.6 u[IU]/mL (ref 0.450–4.500)

## 2020-04-25 LAB — PSA: Prostate Specific Ag, Serum: 0.9 ng/mL (ref 0.0–4.0)

## 2020-04-27 NOTE — Progress Notes (Signed)
Pt needs to be seen for labs follow up, worsening renal functions.

## 2020-04-28 ENCOUNTER — Ambulatory Visit (INDEPENDENT_AMBULATORY_CARE_PROVIDER_SITE_OTHER): Admitting: Hospice and Palliative Medicine

## 2020-04-28 ENCOUNTER — Encounter: Payer: Self-pay | Admitting: Hospice and Palliative Medicine

## 2020-04-28 ENCOUNTER — Other Ambulatory Visit: Payer: Self-pay

## 2020-04-28 VITALS — BP 122/85 | HR 81 | Temp 97.5°F | Resp 16 | Ht 67.5 in | Wt 174.0 lb

## 2020-04-28 DIAGNOSIS — N289 Disorder of kidney and ureter, unspecified: Secondary | ICD-10-CM | POA: Diagnosis not present

## 2020-04-28 DIAGNOSIS — R944 Abnormal results of kidney function studies: Secondary | ICD-10-CM | POA: Diagnosis not present

## 2020-04-28 NOTE — Progress Notes (Signed)
[facility] [address] [place], [place] [address]  Internal MEDICINE  Office Visit Note  Patient Name: Roger Mclaughlin  [id number]  [id number]  Date of Service: 04/28/2020  Chief Complaint  Patient presents with  . Follow-up    review labs  . Hyperlipidemia  . Gastroesophageal Reflux    HPI Patient is here to review results of his labs completed on 8/9. Discussed his impaired renal function. Creatinine has steadily increased over the last year. Most recent creatinine is 1.52, his GFR also decreased to 58. Pt does consume NSAIDS on regular basis, does not monitor blood pressure at home t.  Current Medication: Outpatient Encounter Medications as of 04/28/2020  Medication Sig  . Acetaminophen-Caffeine (EXCEDRIN ASPIRIN FREE PO) Take by mouth daily.  Marland Kitchen atorvastatin (LIPITOR) 20 MG tablet Take 1 tablet (20 mg total) by mouth at bedtime.  . clidinium-chlordiazePOXIDE (LIBRAX) 5-2.5 MG capsule Take 1 capsule by mouth 3 (three) times daily before meals.  Marland Kitchen esomeprazole (NEXIUM) 40 MG capsule Take 1 capsule (40 mg total) by mouth daily at 12 noon.  Marland Kitchen levothyroxine (SYNTHROID) 50 MCG tablet TAKE 1 TABLET EVERY MORNING BEFORE BREAKFAST  . Magnesium 500 MG TABS Take 2 tablets by mouth daily.  . polyethylene glycol powder (MIRALAX) powder Take 1 Container by mouth every evening.   No facility-administered encounter medications on file as of 04/28/2020.    Surgical History: Past Surgical History:  Procedure Laterality Date  . COLONOSCOPY    . COLONOSCOPY WITH PROPOFOL N/A 01/23/2017   Procedure: COLONOSCOPY WITH PROPOFOL;  Surgeon: Wyline Mood, MD;  Location: [facility] ENDOSCOPY;  Service: Endoscopy;  Laterality: N/A;  . ESOPHAGOGASTRODUODENOSCOPY    . WISDOM TOOTH EXTRACTION      Medical History: Past Medical History:  Diagnosis Date  . Chronic sinusitis   . Constipation   . GERD (gastroesophageal reflux disease)   . Hypercholesteremia   . Hypothyroidism   . Irritable  bowel syndrome     Family History: Family History  Problem Relation Age of Onset  . Irritable bowel syndrome Mother   . Hypertension Mother   . Diabetes Mother   . Prostate cancer Father   . Hypertension Father   . Diabetes Father   . Irritable bowel syndrome Sister     Social History   Socioeconomic History  . Marital status: Single    Spouse name: Not on file  . Number of children: Not on file  . Years of education: Not on file  . Highest education level: Not on file  Occupational History  . Not on file  Tobacco Use  . Smoking status: Never Smoker  . Smokeless tobacco: Never Used  Vaping Use  . Vaping Use: Never used  Substance and Sexual Activity  . Alcohol use: Yes    Alcohol/week: 7.0 standard drinks    Types: 7 Glasses of wine per week  . Drug use: No  . Sexual activity: Yes    Birth control/protection: Condom  Other Topics Concern  . Not on file  Social History Narrative  . Not on file   Social Determinants of Health   Financial Resource Strain:   . Difficulty of Paying Living Expenses:   Food Insecurity:   . Worried About Programme researcher, broadcasting/film/video in the Last Year:   . Barista in the Last Year:   Transportation Needs:   . Freight forwarder (Medical):   Marland Kitchen Lack of Transportation (Non-Medical):   Physical Activity:   .  Days of Exercise per Week:   . Minutes of Exercise per Session:   Stress:   . Feeling of Stress :   Social Connections:   . Frequency of Communication with Friends and Family:   . Frequency of Social Gatherings with Friends and Family:   . Attends Religious Services:   . Active Member of Clubs or Organizations:   . Attends Banker Meetings:   Marland Kitchen Marital Status:   Intimate Partner Violence:   . Fear of Current or Ex-Partner:   . Emotionally Abused:   Marland Kitchen Physically Abused:   . Sexually Abused:     Review of Systems  Constitutional: Negative for chills, fatigue and unexpected weight change.  HENT: Negative for  congestion, sneezing and sore throat.   Eyes: Negative for redness.  Respiratory: Negative for cough, chest tightness and shortness of breath.   Cardiovascular: Negative for chest pain, palpitations and leg swelling.  Gastrointestinal: Negative for abdominal pain, constipation, diarrhea, nausea and vomiting.  Genitourinary: Negative for decreased urine volume, dysuria, flank pain, frequency and hematuria.  Musculoskeletal: Negative for arthralgias, back pain, joint swelling and neck pain.  Skin: Negative for rash.  Neurological: Negative.  Negative for tremors and numbness.  Hematological: Negative for adenopathy. Does not bruise/bleed easily.  Psychiatric/Behavioral: Negative for behavioral problems (Depression), sleep disturbance and suicidal ideas. The patient is not nervous/anxious.    Vital Signs: BP 122/85   Pulse 81   Temp (!) 97.5 F (36.4 C)   Resp 16   Ht 5' 7.5" (1.715 m)   Wt 174 lb (78.9 kg)   SpO2 99%   BMI 26.85 kg/m    Physical Exam Vitals reviewed.  Constitutional:      Appearance: Normal appearance. He is normal weight.  HENT:     Nose: Nose normal.     Mouth/Throat:     Mouth: Mucous membranes are moist.     Pharynx: Oropharynx is clear.  Cardiovascular:     Rate and Rhythm: Normal rate and regular rhythm.     Pulses: Normal pulses.     Heart sounds: Normal heart sounds.  Pulmonary:     Effort: Pulmonary effort is normal.     Breath sounds: Normal breath sounds.  Abdominal:     General: Abdomen is flat. Bowel sounds are normal.  Musculoskeletal:        General: Normal range of motion.     Cervical back: Normal range of motion.  Skin:    General: Skin is warm.  Neurological:     General: No focal deficit present.     Mental Status: He is alert and oriented to person, place, and time. Mental status is at baseline.  Psychiatric:        Mood and Affect: Mood normal.        Thought Content: Thought content normal.    Assessment/Plan: 1. Impaired  renal function Elevated creatinine and decreased GFR. Will review results of [place] and repeat BMET at next visit. Will also need to check his urine for proteinuria at next visit. Instructed to avoid NSAIDs. - US Renal; Future// if normal then might need RA u/s, also keep an eye on dias BP  - Basic Metabolic Panel (BMET) - WILL MONITOR BP   General Counseling: Dahir verbalizes understanding of the findings of todays visit and agrees with plan of treatment. I have discussed any further diagnostic evaluation that may be needed or ordered today. We also reviewed his medications today. he has been encouraged  to call the office with any questions or concerns that should arise related to todays visit.    Orders Placed This Encounter  Procedures  . US Renal  . Basic Metabolic Panel (BMET)    Time spent: 20 Minutes   This patient was seen by Leeanne Deed AGNP-C in Collaboration with Dr Lyndon Code as a part of collaborative care agreement     Lubertha Basque. Sharry Beining AGNP-C Internal medicine

## 2020-04-28 NOTE — Progress Notes (Signed)
Spoke with pt make labs follow up for today

## 2020-05-26 ENCOUNTER — Other Ambulatory Visit: Payer: Self-pay

## 2020-05-26 ENCOUNTER — Other Ambulatory Visit

## 2020-05-26 ENCOUNTER — Ambulatory Visit (INDEPENDENT_AMBULATORY_CARE_PROVIDER_SITE_OTHER)

## 2020-05-26 DIAGNOSIS — N289 Disorder of kidney and ureter, unspecified: Secondary | ICD-10-CM | POA: Diagnosis not present

## 2020-05-29 NOTE — Progress Notes (Signed)
Normal renal US, will discuss with him at next follow-up appointment.

## 2020-05-31 ENCOUNTER — Other Ambulatory Visit: Payer: Self-pay

## 2020-05-31 MED ORDER — ATORVASTATIN CALCIUM 20 MG PO TABS
20.0000 mg | ORAL_TABLET | Freq: Every day | ORAL | 0 refills | Status: DC
Start: 2020-05-31 — End: 2020-08-07

## 2020-06-01 ENCOUNTER — Ambulatory Visit: Admitting: Hospice and Palliative Medicine

## 2020-06-08 LAB — BASIC METABOLIC PANEL
BUN/Creatinine Ratio: 7 — ABNORMAL LOW (ref 9–20)
BUN: 9 mg/dL (ref 6–24)
CO2: 21 mmol/L (ref 20–29)
Calcium: 10 mg/dL (ref 8.7–10.2)
Chloride: 104 mmol/L (ref 96–106)
Creatinine, Ser: 1.35 mg/dL — ABNORMAL HIGH (ref 0.76–1.27)
GFR calc Af Amer: 66 mL/min/{1.73_m2} (ref >59)
GFR calc non Af Amer: 57 mL/min/{1.73_m2} — ABNORMAL LOW (ref >59)
Glucose: 129 mg/dL — ABNORMAL HIGH (ref 65–99)
Potassium: 4.1 mmol/L (ref 3.5–5.2)
Sodium: 139 mmol/L (ref 134–144)

## 2020-06-09 ENCOUNTER — Other Ambulatory Visit

## 2020-06-13 ENCOUNTER — Other Ambulatory Visit: Payer: Self-pay

## 2020-06-13 ENCOUNTER — Encounter: Payer: Self-pay | Admitting: Hospice and Palliative Medicine

## 2020-06-13 ENCOUNTER — Ambulatory Visit (INDEPENDENT_AMBULATORY_CARE_PROVIDER_SITE_OTHER): Admitting: Hospice and Palliative Medicine

## 2020-06-13 DIAGNOSIS — R519 Headache, unspecified: Secondary | ICD-10-CM | POA: Diagnosis not present

## 2020-06-13 DIAGNOSIS — J324 Chronic pansinusitis: Secondary | ICD-10-CM | POA: Diagnosis not present

## 2020-06-13 DIAGNOSIS — G8929 Other chronic pain: Secondary | ICD-10-CM | POA: Diagnosis not present

## 2020-06-13 DIAGNOSIS — N289 Disorder of kidney and ureter, unspecified: Secondary | ICD-10-CM | POA: Diagnosis not present

## 2020-06-13 NOTE — Progress Notes (Signed)
[facility] [address] [place], [place] [address]  Internal MEDICINE  Office Visit Note  Patient Name: Roger Mclaughlin  [id number]  [id number]  Date of Service: 06/13/2020  Chief Complaint  Patient presents with  . Follow-up    reviewing labs  . Hyperlipidemia  . Quality Metric Gaps    TDAP    HPI Patient is here for routine follow-up We have been closely monitoring his kidney functions They have improved since initial check, renal US normal Discussed that his renal impairment is potentially related his everyday use of Excedrin He has taken Excedrin daily for many years due to sinus headaches, he is followed by ENT but has not been offered alternative treatments? He says he has tried multiple OTC headaches medications but Excedrin has been the only medication that resolves his headaches Has had allergy testing several years ago and was found to have multiple allergies, did not seek further treatment at that time   Current Medication: Outpatient Encounter Medications as of 06/13/2020  Medication Sig  . Acetaminophen-Caffeine (EXCEDRIN ASPIRIN FREE PO) Take by mouth daily.  Marland Kitchen atorvastatin (LIPITOR) 20 MG tablet Take 1 tablet (20 mg total) by mouth at bedtime.  . clidinium-chlordiazePOXIDE (LIBRAX) 5-2.5 MG capsule Take 1 capsule by mouth 3 (three) times daily before meals.  Marland Kitchen esomeprazole (NEXIUM) 40 MG capsule Take 1 capsule (40 mg total) by mouth daily at 12 noon.  Marland Kitchen levothyroxine (SYNTHROID) 50 MCG tablet TAKE 1 TABLET EVERY MORNING BEFORE BREAKFAST  . Magnesium 500 MG TABS Take 2 tablets by mouth daily.  . polyethylene glycol powder (MIRALAX) powder Take 1 Container by mouth every evening.   No facility-administered encounter medications on file as of 06/13/2020.    Surgical History: Past Surgical History:  Procedure Laterality Date  . COLONOSCOPY    . COLONOSCOPY WITH PROPOFOL N/A 01/23/2017   Procedure: COLONOSCOPY WITH PROPOFOL;  Surgeon: Wyline Mood,  MD;  Location: [facility] ENDOSCOPY;  Service: Endoscopy;  Laterality: N/A;  . ESOPHAGOGASTRODUODENOSCOPY    . WISDOM TOOTH EXTRACTION      Medical History: Past Medical History:  Diagnosis Date  . Chronic sinusitis   . Constipation   . GERD (gastroesophageal reflux disease)   . Hypercholesteremia   . Hypothyroidism   . Irritable bowel syndrome     Family History: Family History  Problem Relation Age of Onset  . Irritable bowel syndrome Mother   . Hypertension Mother   . Diabetes Mother   . Prostate cancer Father   . Hypertension Father   . Diabetes Father   . Irritable bowel syndrome Sister     Social History   Socioeconomic History  . Marital status: Single    Spouse name: Not on file  . Number of children: Not on file  . Years of education: Not on file  . Highest education level: Not on file  Occupational History  . Not on file  Tobacco Use  . Smoking status: Never Smoker  . Smokeless tobacco: Never Used  Vaping Use  . Vaping Use: Never used  Substance and Sexual Activity  . Alcohol use: Yes    Alcohol/week: 7.0 standard drinks    Types: 7 Glasses of wine per week    Comment: 1 glass of wine a day  . Drug use: No  . Sexual activity: Yes    Birth control/protection: Condom  Other Topics Concern  . Not on file  Social History Narrative  . Not on file   Social Determinants of  Health   Financial Resource Strain:   . Difficulty of Paying Living Expenses: Not on file  Food Insecurity:   . Worried About Programme researcher, broadcasting/film/video in the Last Year: Not on file  . Ran Out of Food in the Last Year: Not on file  Transportation Needs:   . Lack of Transportation (Medical): Not on file  . Lack of Transportation (Non-Medical): Not on file  Physical Activity:   . Days of Exercise per Week: Not on file  . Minutes of Exercise per Session: Not on file  Stress:   . Feeling of Stress : Not on file  Social Connections:   . Frequency of Communication with Friends and Family:  Not on file  . Frequency of Social Gatherings with Friends and Family: Not on file  . Attends Religious Services: Not on file  . Active Member of Clubs or Organizations: Not on file  . Attends Banker Meetings: Not on file  . Marital Status: Not on file  Intimate Partner Violence:   . Fear of Current or Ex-Partner: Not on file  . Emotionally Abused: Not on file  . Physically Abused: Not on file  . Sexually Abused: Not on file      Review of Systems  Constitutional: Negative for chills, fatigue and unexpected weight change.  HENT: Negative for congestion, postnasal drip, rhinorrhea, sneezing and sore throat.   Eyes: Negative for redness.  Respiratory: Negative for cough, chest tightness and shortness of breath.   Cardiovascular: Negative for chest pain, palpitations and leg swelling.  Gastrointestinal: Negative for abdominal pain, constipation, diarrhea, nausea and vomiting.  Genitourinary: Negative for dysuria and frequency.  Musculoskeletal: Negative for arthralgias, back pain, joint swelling and neck pain.  Skin: Negative for color change and rash.  Allergic/Immunologic: Positive for environmental allergies.  Neurological: Positive for headaches. Negative for tremors and numbness.  Hematological: Negative for adenopathy. Does not bruise/bleed easily.  Psychiatric/Behavioral: Negative for behavioral problems (Depression), sleep disturbance and suicidal ideas. The patient is not nervous/anxious.     Vital Signs: BP 132/80   Pulse 87   Temp 98.3 F (36.8 C)   Resp 16   Ht 5\' 7"  (1.702 m)   Wt 171 lb 3.2 oz (77.7 kg)   SpO2 97%   BMI 26.81 kg/m    Physical Exam Vitals reviewed.  Constitutional:      Appearance: Normal appearance. He is normal weight.  HENT:     Mouth/Throat:     Mouth: Mucous membranes are moist.     Pharynx: Oropharynx is clear.  Cardiovascular:     Rate and Rhythm: Normal rate and regular rhythm.     Pulses: Normal pulses.     Heart  sounds: Normal heart sounds.  Pulmonary:     Effort: Pulmonary effort is normal.     Breath sounds: Normal breath sounds.  Abdominal:     General: Abdomen is flat.     Palpations: Abdomen is soft.  Musculoskeletal:        General: Normal range of motion.     Cervical back: Normal range of motion.  Skin:    General: Skin is warm.  Neurological:     General: No focal deficit present.     Mental Status: He is alert and oriented to person, place, and time. Mental status is at baseline.  Psychiatric:        Mood and Affect: Mood normal.        Behavior: Behavior normal.  Thought Content: Thought content normal.    Assessment/Plan: 1. Chronic pansinusitis Will do in office allergy testing and start allergy injections as warranted. - Allergy Panel, Region 2, [place]  2. Chronic intractable headache, unspecified headache type Samples of Nurtec 75 mg given, to be taken every other day for prevention of headaches, goal is to wean himself off of Excedrin that does include NSAID (ASA) which potentially is contributing to his impaired renal function  3. Impaired renal function Has improved, will routinely monitor May need nephrology referral in future if levels worsen  General Counseling: Donzetta Starch understanding of the findings of todays visit and agrees with plan of treatment. I have discussed any further diagnostic evaluation that may be needed or ordered today. We also reviewed his medications today. he has been encouraged to call the office with any questions or concerns that should arise related to todays visit.    Orders Placed This Encounter  Procedures  . Allergy Panel, Region 2, [place]    Time spent: 30 Minutes Time spent includes review of chart, medications, test results and follow-up plan with the patient.  This patient was seen by Leeanne Deed AGNP-C in Collaboration with Dr Lyndon Code as a part of collaborative care agreement     Lubertha Basque. Sammuel Blick  AGNP-C Internal medicine

## 2020-06-28 ENCOUNTER — Ambulatory Visit: Admitting: Hospice and Palliative Medicine

## 2020-07-03 ENCOUNTER — Ambulatory Visit (INDEPENDENT_AMBULATORY_CARE_PROVIDER_SITE_OTHER): Admitting: Internal Medicine

## 2020-07-03 ENCOUNTER — Encounter: Payer: Self-pay | Admitting: Internal Medicine

## 2020-07-03 ENCOUNTER — Other Ambulatory Visit: Payer: Self-pay

## 2020-07-03 VITALS — BP 118/88 | HR 89 | Resp 16 | Ht 67.0 in | Wt 170.8 lb

## 2020-07-03 DIAGNOSIS — J324 Chronic pansinusitis: Secondary | ICD-10-CM

## 2020-07-03 DIAGNOSIS — G4719 Other hypersomnia: Secondary | ICD-10-CM

## 2020-07-03 DIAGNOSIS — G4710 Hypersomnia, unspecified: Secondary | ICD-10-CM | POA: Diagnosis not present

## 2020-07-03 DIAGNOSIS — R4189 Other symptoms and signs involving cognitive functions and awareness: Secondary | ICD-10-CM

## 2020-07-03 DIAGNOSIS — J301 Allergic rhinitis due to pollen: Secondary | ICD-10-CM

## 2020-07-03 DIAGNOSIS — R0683 Snoring: Secondary | ICD-10-CM | POA: Diagnosis not present

## 2020-07-03 NOTE — Progress Notes (Signed)
[facility] [place],  [address]  Pulmonary Sleep Medicine   Office Visit Note  Patient Name: Roger Mclaughlin DOB: 11/13/1960 MRN [id number]  Date of Service: 07/03/2020  Complaints/HPI: Patient is seen because of having headaches.  Patient is not sure if he does have any snoring.  He wakes up however with a brain fog.  He does not sleep with anyone so therefore is not aware of having apneas either witnessed or otherwise.  He does have some difficulty getting on in the mornings.  Patient states that time he is not had a sleep study done before he has been diagnosed previously with sinusitis and so he was referred for allergy testing to assess the sinusitis.  The headaches he states he usually wakes up within the morning.  When I evaluated him he has Mallampati 4 as far as his oropharynx is concerned.  ROS  General: (-) fever, (-) chills, (-) night sweats, (-) weakness Skin: (-) rashes, (-) itching,. Eyes: (-) visual changes, (-) redness, (-) itching. Nose and Sinuses: (-) nasal stuffiness or itchiness, (-) postnasal drip, (-) nosebleeds, (-) sinus trouble. Mouth and Throat: (-) sore throat, (-) hoarseness. Neck: (-) swollen glands, (-) enlarged thyroid, (-) neck pain. Respiratory: + cough, (-) bloody sputum, - shortness of breath, - wheezing. Cardiovascular: - ankle swelling, (-) chest pain. Lymphatic: (-) lymph node enlargement. Neurologic: (-) numbness, (-) tingling. Psychiatric: (-) anxiety, (-) depression   Current Medication: Outpatient Encounter Medications as of 07/03/2020  Medication Sig  . Acetaminophen-Caffeine (EXCEDRIN ASPIRIN FREE PO) Take by mouth daily.  Marland Kitchen atorvastatin (LIPITOR) 20 MG tablet Take 1 tablet (20 mg total) by mouth at bedtime.  . clidinium-chlordiazePOXIDE (LIBRAX) 5-2.5 MG capsule Take 1 capsule by mouth 3 (three) times daily before meals.  Marland Kitchen esomeprazole (NEXIUM) 40 MG capsule Take 1 capsule (40 mg total) by mouth daily  at 12 noon.  Marland Kitchen levothyroxine (SYNTHROID) 50 MCG tablet TAKE 1 TABLET EVERY MORNING BEFORE BREAKFAST  . Magnesium 500 MG TABS Take 2 tablets by mouth daily.  . polyethylene glycol powder (MIRALAX) powder Take 1 Container by mouth every evening.   No facility-administered encounter medications on file as of 07/03/2020.    Surgical History: Past Surgical History:  Procedure Laterality Date  . COLONOSCOPY    . COLONOSCOPY WITH PROPOFOL N/A 01/23/2017   Procedure: COLONOSCOPY WITH PROPOFOL;  Surgeon: Jonathon Bellows, MD;  Location: [facility] ENDOSCOPY;  Service: Endoscopy;  Laterality: N/A;  . ESOPHAGOGASTRODUODENOSCOPY    . WISDOM TOOTH EXTRACTION      Medical History: Past Medical History:  Diagnosis Date  . Chronic sinusitis   . Constipation   . GERD (gastroesophageal reflux disease)   . Hypercholesteremia   . Hypothyroidism   . Irritable bowel syndrome     Family History: Family History  Problem Relation Age of Onset  . Irritable bowel syndrome Mother   . Hypertension Mother   . Diabetes Mother   . Prostate cancer Father   . Hypertension Father   . Diabetes Father   . Irritable bowel syndrome Sister     Social History: Social History   Socioeconomic History  . Marital status: Single    Spouse name: Not on file  . Number of children: Not on file  . Years of education: Not on file  . Highest education level: Not on file  Occupational History  . Not on file  Tobacco Use  . Smoking status: Never Smoker  . Smokeless tobacco: Never Used  Vaping Use  . Vaping Use: Never used  Substance and Sexual Activity  . Alcohol use: Yes    Alcohol/week: 7.0 standard drinks    Types: 7 Glasses of wine per week    Comment: 1 glass of wine a day  . Drug use: No  . Sexual activity: Yes    Birth control/protection: Condom  Other Topics Concern  . Not on file  Social History Narrative  . Not on file   Social Determinants of Health   Financial Resource Strain:   . Difficulty of  Paying Living Expenses: Not on file  Food Insecurity:   . Worried About Charity fundraiser in the Last Year: Not on file  . Ran Out of Food in the Last Year: Not on file  Transportation Needs:   . Lack of Transportation (Medical): Not on file  . Lack of Transportation (Non-Medical): Not on file  Physical Activity:   . Days of Exercise per Week: Not on file  . Minutes of Exercise per Session: Not on file  Stress:   . Feeling of Stress : Not on file  Social Connections:   . Frequency of Communication with Friends and Family: Not on file  . Frequency of Social Gatherings with Friends and Family: Not on file  . Attends Religious Services: Not on file  . Active Member of Clubs or Organizations: Not on file  . Attends Archivist Meetings: Not on file  . Marital Status: Not on file  Intimate Partner Violence:   . Fear of Current or Ex-Partner: Not on file  . Emotionally Abused: Not on file  . Physically Abused: Not on file  . Sexually Abused: Not on file    Vital Signs: Blood pressure 118/88, pulse 89, resp. rate 16, height '5\' 7"'  (1.702 m), weight 170 lb 12.8 oz (77.5 kg), SpO2 97 %.  Examination: General Appearance: The patient is well-developed, well-nourished, and in no distress. Skin: Gross inspection of skin unremarkable. Head: normocephalic, no gross deformities. Eyes: no gross deformities noted. ENT: ears appear grossly normal no exudates. Neck: Supple. No thyromegaly. No LAD. Respiratory: No rhonchi no rales are noted at this time. Cardiovascular: Normal S1 and S2 without murmur or rub. Extremities: No cyanosis. pulses are equal. Neurologic: Alert and oriented. No involuntary movements.  LABS: Recent Results (from the past 2160 hour(s))  CBC with Differential/Platelet     Status: None   Collection Time: 04/24/20 11:51 AM  Result Value Ref Range   WBC 4.2 3.4 - 10.8 x10E3/uL   RBC 5.20 4.14 - 5.80 x10E6/uL   Hemoglobin 15.4 13.0 - 17.7 g/dL   Hematocrit  45.5 37.5 - 51.0 %   MCV 88 79 - 97 fL   MCH 29.6 26.6 - 33.0 pg   MCHC 33.8 31 - 35 g/dL   RDW 11.9 11.6 - 15.4 %   Platelets 164 150 - 450 x10E3/uL   Neutrophils 39 Not Estab. %   Lymphs 47 Not Estab. %   Monocytes 9 Not Estab. %   Eos 4 Not Estab. %   Basos 1 Not Estab. %   Neutrophils Absolute 1.7 1 - 7 x10E3/uL   Lymphocytes Absolute 2.0 0 - 3 x10E3/uL   Monocytes Absolute 0.4 0 - 0 x10E3/uL   EOS (ABSOLUTE) 0.2 0.0 - 0.4 x10E3/uL   Basophils Absolute 0.0 0 - 0 x10E3/uL   Immature Granulocytes 0 Not Estab. %   Immature Grans (Abs) 0.0 0.0 - 0.1 x10E3/uL  Lipid Panel  With LDL/HDL Ratio     Status: Abnormal   Collection Time: 04/24/20 11:51 AM  Result Value Ref Range   Cholesterol, Total 198 100 - 199 mg/dL   Triglycerides 120 0 - 149 mg/dL   HDL 65 >39 mg/dL   VLDL Cholesterol Cal 21 5 - 40 mg/dL   LDL Chol Calc (NIH) 112 (H) 0 - 99 mg/dL   LDL/HDL Ratio 1.7 0.0 - 3.6 ratio    Comment:                                     LDL/HDL Ratio                                             Men  Women                               1/2 Avg.Risk  1.0    1.5                                   Avg.Risk  3.6    3.2                                2X Avg.Risk  6.2    5.0                                3X Avg.Risk  8.0    6.1   TSH     Status: None   Collection Time: 04/24/20 11:51 AM  Result Value Ref Range   TSH 1.600 0.450 - 4.500 uIU/mL  T4, free     Status: None   Collection Time: 04/24/20 11:51 AM  Result Value Ref Range   Free T4 0.99 0.82 - 1.77 ng/dL  Comprehensive metabolic panel     Status: Abnormal   Collection Time: 04/24/20 11:51 AM  Result Value Ref Range   Glucose 94 65 - 99 mg/dL   BUN 15 6 - 24 mg/dL   Creatinine, Ser 1.52 (H) 0.76 - 1.27 mg/dL   GFR calc non Af Amer 50 (L) >59 mL/min/1.73   GFR calc Af Amer 58 (L) >59 mL/min/1.73    Comment: **[facility] currently reports eGFR in compliance with the current**   recommendations of the [employer].  [facility] will   update reporting as new guidelines are published from the NKF-ASN   Task force.    BUN/Creatinine Ratio 10 9 - 20   Sodium 138 134 - 144 mmol/L   Potassium 4.3 3.5 - 5.2 mmol/L   Chloride 101 96 - 106 mmol/L   CO2 21 20 - 29 mmol/L   Calcium 9.9 8.7 - 10.2 mg/dL   Total Protein 7.4 6.0 - 8.5 g/dL   Albumin 4.9 3.8 - 4.9 g/dL   Globulin, Total 2.5 1.5 - 4.5 g/dL   Albumin/Globulin Ratio 2.0 1.2 - 2.2   Bilirubin Total 0.3 0.0 - 1.2 mg/dL   Alkaline Phosphatase 94 48 - 121 IU/L   AST 40 0 - 40 IU/L  ALT 56 (H) 0 - 44 IU/L  PSA     Status: None   Collection Time: 04/24/20 11:51 AM  Result Value Ref Range   Prostate Specific Ag, Serum 0.9 0.0 - 4.0 ng/mL    Comment: Roche ECLIA methodology. According to the American Urological Association, Serum PSA should decrease and remain at undetectable levels after radical prostatectomy. The AUA defines biochemical recurrence as an initial PSA value 0.2 ng/mL or greater followed by a subsequent confirmatory PSA value 0.2 ng/mL or greater. Values obtained with different assay methods or kits cannot be used interchangeably. Results cannot be interpreted as absolute evidence of the presence or absence of malignant disease.   Basic Metabolic Panel (BMET)     Status: Abnormal   Collection Time: 06/07/20  3:41 PM  Result Value Ref Range   Glucose 129 (H) 65 - 99 mg/dL   BUN 9 6 - 24 mg/dL   Creatinine, Ser 1.35 (H) 0.76 - 1.27 mg/dL   GFR calc non Af Amer 57 (L) >59 mL/min/1.73   GFR calc Af Amer 66 >59 mL/min/1.73    Comment: **[facility] currently reports eGFR in compliance with the current**   recommendations of the [employer]. [facility] will   update reporting as new guidelines are published from the NKF-ASN   Task force.    BUN/Creatinine Ratio 7 (L) 9 - 20   Sodium 139 134 - 144 mmol/L   Potassium 4.1 3.5 - 5.2 mmol/L   Chloride 104 96 - 106 mmol/L   CO2 21 20 - 29 mmol/L   Calcium 10.0 8.7 - 10.2 mg/dL     Radiology: No results found.  No results found.  No results found.    Assessment and Plan: Patient Active Problem List   Diagnosis Date Noted  . Elevated serum creatinine 10/26/2019  . Elevated liver function tests 10/26/2019  . Other fatigue 07/05/2018  . Irritable bowel syndrome with constipation 07/05/2018  . Generalized anxiety disorder 07/05/2018  . Gastroesophageal reflux disease without esophagitis 01/14/2018  . Encounter for general adult medical examination with abnormal findings 01/14/2018  . Generalized colicky abdominal pain 01/14/2018  . Hypothyroidism 01/14/2018  . Dysuria 01/14/2018    1. Hypersomnia symptoms are vague but he does have some fatigue brain fog during the daytime states he feels tired.  He is also having headaches when he wakes up in the mornings.  That along with him being overweight would be sufficient to see about getting a sleep study done.  Because of his inconsistent symptoms he is at high risk for obstructive sleep apnea with also Mallampati 4 finding on the physical examination. 2. sinusisits he has history of sinusitis could not find it if he has ever had sinus CTs done I am to go ahead and order a CT of his sinuses. 3. Headaches/Brain Fog/Snoring the symptoms though atypical could be consistent with obstructive sleep apnea suggest getting a sleep study and lab done for further assessment evaluation.  In addition patient does have history of hypothyroidism. 4. Allergic rhinitis allergy tested the results of the allergies are rather insignificant not enough to explain his overall symptoms that he has been complaining of.  General Counseling: I have discussed the findings of the evaluation and examination with Roger Mclaughlin.  I have also discussed any further diagnostic evaluation thatmay be needed or ordered today. Roger Mclaughlin verbalizes understanding of the findings of todays visit. We also reviewed his medications today and discussed drug interactions  and side effects including but not  limited excessive drowsiness and altered mental states. We also discussed that there is always a risk not just to him but also people around him. he has been encouraged to call the office with any questions or concerns that should arise related to todays visit.  Orders Placed This Encounter  Procedures  . Allergy Test    Order Specific Question:   Allergy test to perform    Answer:   8  . CT Maxillofacial WO CM    Standing Status:   Future    Standing Expiration Date:   07/03/2021    Order Specific Question:   Preferred imaging location?    Answer:   [facility]  . PSG Sleep Study    Standing Status:   Future    Standing Expiration Date:   07/03/2021    Order Specific Question:   Where should this test be performed:    Answer:   [facility]     Time spent: 61  I have personally obtained a history, examined the patient, evaluated laboratory and imaging results, formulated the assessment and plan and placed orders.    Allyne Gee, MD [facility] Pulmonary and Critical Care Sleep medicine

## 2020-07-03 NOTE — Patient Instructions (Signed)

## 2020-07-03 NOTE — Procedures (Signed)
    [facility] Allergy MQT Recording Form  [facility] [address] [place], [place] [address] Phone [phone] Fax [phone]   Patient Name: Roger Mclaughlin Age: 59 y.o. Sex: male Date of Service: @SERVICEDATE @   Performing Provider: MD [place] [facility]         Battery A Back   Site Antigen [facility] Flare  A1 Positive Control 10 10  A2 Negative Control 4 5  A3 American Elm 0 0  A4 Maple Box Elder 0 0  A5 Grass Mix 0 0  A6 Dock Sorrel Mix 6 7  A7 Russian Thistle 0 0  A8 Ragweed Mix 0 0  A9 English Pantain 0 0  A10 Oak Mix  0 0   Battery B Wheal Flare  B1 Lambs Quarters 9 10  B2 Cottonwood 7 9  B3 Pigweed Mix 0 0  B4 Acacia 0 0  B5 Pine Mix 0 0  B6 Privet 7 11  B7 White/Red Mulberry 0 0  B8 Western Water Hemp 7 9  B9 [facility] Grass 0 0  B10 Melalucea 0 0   Battery C Wheal Flare  C1 Red River Birch 0 0  C2 Eastern Sycamore 0 0  C3 Bahai Grass 0 0  C4 American Beech 0 0  C5 Ash Mix 0 0  C6 Black Willow 7 9  C7 Hickory 0 0  C8 Black Walnut 0 0  C9 Red Cedar 0 0  C10 Sweet Gum  0 0   Battery D Wheal Flare  D1 Cultivated Oat 0 0  D2 Dog Fennel 0 0  D3 Common Mugwort 0 0  D4 Marsh Elder 0 0  D5 Johnson 0 0  D6 Hackberry Tree 0 0  D7 Bayberry Tree 0 0  D8 Cypress, Bald Tree 0 0  D9 Aspergillus Fumigatus 0 0  D10 Alternia  0 0   Battery E Wheal Flare  E1 Dreschlere 0 00  E2 Fusarium Mix 0 0  E3 Cladosporum Sph 0 0  E4 Bipolaris 0 0  E5 Penicillin chrys 0 0  E6 Cladosporum Herb 0 0  E7 Candida 0 0  E8 Aureobasidium 0 0  E9 Rhizopus 0 0  E10 Botrytis  7 9   Battery F Wheal Flare  F1 Aspergillus [place] 0 0  F2 Dust Mite Mix 0 0  F3 Cockroach Mix 0 0  F4 Cat Hair 0 0  F5 Dog Mixed breeds 0 0  F6 Feather Mix 0 0

## 2020-07-06 ENCOUNTER — Telehealth: Payer: Self-pay

## 2020-07-06 NOTE — Telephone Encounter (Signed)
Gave [facility] order form for sleep study. Saraiya Kozma 

## 2020-07-11 ENCOUNTER — Ambulatory Visit
Admission: RE | Admit: 2020-07-11 | Discharge: 2020-07-11 | Disposition: A | Discharge status: Home / Self Care | Source: Ambulatory Visit | Attending: Internal Medicine | Admitting: Internal Medicine

## 2020-07-11 ENCOUNTER — Other Ambulatory Visit: Payer: Self-pay

## 2020-07-11 DIAGNOSIS — J324 Chronic pansinusitis: Secondary | ICD-10-CM | POA: Diagnosis not present

## 2020-07-19 ENCOUNTER — Encounter: Payer: Self-pay | Admitting: Hospice and Palliative Medicine

## 2020-07-19 ENCOUNTER — Other Ambulatory Visit: Payer: Self-pay

## 2020-07-19 ENCOUNTER — Ambulatory Visit (INDEPENDENT_AMBULATORY_CARE_PROVIDER_SITE_OTHER): Admitting: Hospice and Palliative Medicine

## 2020-07-19 DIAGNOSIS — R519 Headache, unspecified: Secondary | ICD-10-CM

## 2020-07-19 DIAGNOSIS — N3943 Post-void dribbling: Secondary | ICD-10-CM | POA: Diagnosis not present

## 2020-07-19 DIAGNOSIS — G8929 Other chronic pain: Secondary | ICD-10-CM

## 2020-07-19 DIAGNOSIS — G4719 Other hypersomnia: Secondary | ICD-10-CM | POA: Diagnosis not present

## 2020-07-19 DIAGNOSIS — N289 Disorder of kidney and ureter, unspecified: Secondary | ICD-10-CM | POA: Diagnosis not present

## 2020-07-19 MED ORDER — TAMSULOSIN HCL 0.4 MG PO CAPS: 0.4000 mg | ORAL_CAPSULE | Freq: Every day | ORAL | 3 refills | Status: DC

## 2020-07-19 NOTE — Progress Notes (Signed)
[facility] [address] [place], [place] [address]  Internal MEDICINE  Office Visit Note  Patient Name: Roger Mclaughlin  [id number]  [id number]  Date of Service: 07/24/2020  Chief Complaint  Patient presents with   Follow-up   Hyperlipidemia   Hypothyroidism    HPI Patient is here for routine follow-up Has been followed closely for abnormal kidney function likely from daily use of Excedrin Migraine for over 30 years--Excedrin taken daily for sinus headaches? Was given samples of Nurtec at last visit to try to manage migraines--found no relief in headaches, started back on Excedrin  Referred to pulmonology for allergy testing--insignificant results CT sinuses ordered, results reviewed with him-Trace mucosal thickening along the floor of the right maxillary sinus. Otherwise clear paranasal sinuses. Patent sinus drainage Pathways. Sinusitis unlikely cause of daily headaches  At pulmonology visit, PSG ordered to assess for OSA--has not yet received a call to schedule PSG--mentions his hesitancy about going somewhere to sleep overnight, agrees with the plan of being tested for OSA Reports fatigue during the day, snoring as well as brain fog  Would like to discuss urinary symptoms--reports getting up a few times a night to urinate, occasionally has difficulty initiating urinary stream, reports dribbling and feels that he is unable to empty his bladder--symptoms have been ongoing for several months and slowly seem to be worsening  Current Medication: Outpatient Encounter Medications as of 07/19/2020  Medication Sig   Acetaminophen-Caffeine (EXCEDRIN ASPIRIN FREE PO) Take by mouth daily.   atorvastatin (LIPITOR) 20 MG tablet Take 1 tablet (20 mg total) by mouth at bedtime.   clidinium-chlordiazePOXIDE (LIBRAX) 5-2.5 MG capsule Take 1 capsule by mouth 3 (three) times daily before meals.   esomeprazole (NEXIUM) 40 MG capsule Take 1 capsule (40 mg total) by mouth daily  at 12 noon.   levothyroxine (SYNTHROID) 50 MCG tablet TAKE 1 TABLET EVERY MORNING BEFORE BREAKFAST   Magnesium 500 MG TABS Take 2 tablets by mouth daily.   polyethylene glycol powder (MIRALAX) powder Take 1 Container by mouth every evening.   tamsulosin (FLOMAX) 0.4 MG CAPS capsule Take 1 capsule (0.4 mg total) by mouth daily.   No facility-administered encounter medications on file as of 07/19/2020.    Surgical History: Past Surgical History:  Procedure Laterality Date   COLONOSCOPY     COLONOSCOPY WITH PROPOFOL N/A 01/23/2017   Procedure: COLONOSCOPY WITH PROPOFOL;  Surgeon: Wyline Mood, MD;  Location: [facility] ENDOSCOPY;  Service: Endoscopy;  Laterality: N/A;   ESOPHAGOGASTRODUODENOSCOPY     WISDOM TOOTH EXTRACTION      Medical History: Past Medical History:  Diagnosis Date   Chronic sinusitis    Constipation    GERD (gastroesophageal reflux disease)    Hypercholesteremia    Hypothyroidism    Irritable bowel syndrome     Family History: Family History  Problem Relation Age of Onset   Irritable bowel syndrome Mother    Hypertension Mother    Diabetes Mother    Prostate cancer Father    Hypertension Father    Diabetes Father    Irritable bowel syndrome Sister     Social History   Socioeconomic History   Marital status: Single    Spouse name: Not on file   Number of children: Not on file   Years of education: Not on file   Highest education level: Not on file  Occupational History   Not on file  Tobacco Use   Smoking status: Never Smoker   Smokeless tobacco: Never Used  Vaping Use   Vaping Use: Never used  Substance and Sexual Activity   Alcohol use: Yes    Alcohol/week: 7.0 standard drinks    Types: 7 Glasses of wine per week    Comment: 1 glass of wine a day   Drug use: No   Sexual activity: Yes    Birth control/protection: Condom  Other Topics Concern   Not on file  Social History Narrative   Not on file   Social  Determinants of Health   Financial Resource Strain:    Difficulty of Paying Living Expenses: Not on file  Food Insecurity:    Worried About Programme researcher, broadcasting/film/video in the Last Year: Not on file   [employer] of Food in the Last Year: Not on file  Transportation Needs:    Lack of Transportation (Medical): Not on file   Lack of Transportation (Non-Medical): Not on file  Physical Activity:    Days of Exercise per Week: Not on file   Minutes of Exercise per Session: Not on file  Stress:    Feeling of Stress : Not on file  Social Connections:    Frequency of Communication with Friends and Family: Not on file   Frequency of Social Gatherings with Friends and Family: Not on file   Attends Religious Services: Not on file   Active Member of Clubs or Organizations: Not on file   Attends Banker Meetings: Not on file   Marital Status: Not on file  Intimate Partner Violence:    Fear of Current or Ex-Partner: Not on file   Emotionally Abused: Not on file   Physically Abused: Not on file   Sexually Abused: Not on file      Review of Systems  Constitutional: Positive for fatigue. Negative for chills and unexpected weight change.  HENT: Negative for congestion, postnasal drip, rhinorrhea, sneezing and sore throat.   Eyes: Negative for photophobia, redness and visual disturbance.  Respiratory: Negative for cough, chest tightness and shortness of breath.   Cardiovascular: Negative for chest pain, palpitations and leg swelling.  Gastrointestinal: Negative for abdominal pain, constipation, diarrhea, nausea and vomiting.  Genitourinary: Negative for dysuria and frequency.  Musculoskeletal: Negative for arthralgias, back pain, joint swelling and neck pain.  Skin: Negative for rash.  Allergic/Immunologic: Negative for environmental allergies and food allergies.  Neurological: Positive for headaches. Negative for tremors and numbness.  Hematological: Negative for adenopathy.  Does not bruise/bleed easily.  Psychiatric/Behavioral: Negative for behavioral problems (Depression), sleep disturbance and suicidal ideas. The patient is not nervous/anxious.     Vital Signs: BP 120/84    Pulse 79    Temp 97.8 F (36.6 C)    Resp 16    Ht 5\' 7"  (1.702 m)    Wt 173 lb (78.5 kg)    SpO2 99%    BMI 27.10 kg/m    Physical Exam Constitutional:      Appearance: Normal appearance. He is normal weight.  Cardiovascular:     Rate and Rhythm: Normal rate and regular rhythm.     Pulses: Normal pulses.     Heart sounds: Normal heart sounds.  Pulmonary:     Effort: Pulmonary effort is normal.     Breath sounds: Normal breath sounds.  Musculoskeletal:        General: Normal range of motion.     Cervical back: Normal range of motion.  Skin:    General: Skin is warm.  Neurological:     General: No  focal deficit present.     Mental Status: He is alert and oriented to person, place, and time. Mental status is at baseline.  Psychiatric:        Mood and Affect: Mood normal.        Behavior: Behavior normal.        Thought Content: Thought content normal.     EPWORTH SLEEPINESS SCALE:  Scale:  (0)= no chance of dozing; (1)= slight chance of dozing; (2)= moderate chance of dozing; (3)= high chance of dozing  Chance  Situtation    Sitting and reading: 2    Watching TV: 2    Sitting Inactive in public: 0    As a passenger in car: 0      Lying down to rest: 2    Sitting and talking: 0    Sitting quielty after lunch: 2    In a car, stopped in traffic: 0   TOTAL SCORE:   8 out of 24   Assessment/Plan: 1. Chronic intractable headache, unspecified headache type Advised to wean himself off of Excedrin--seems that he has built a tolerance for medication, could be experiencing rebound headaches  2. Other hypersomnia Will review PSG order from last visit--may be eligible for home sleep study due to hesitancy of sleeping in a lab  3. Impaired renal  function Improved, renal US normal--will continue to closely montior  4. Urinary incontinence, post-void dribbling Will start flomax for dribbling, stream initiation difficulties as well as other urinary complications PSA 0.9--if symptoms persist may need to consider urology referral - tamsulosin (FLOMAX) 0.4 MG CAPS capsule; Take 1 capsule (0.4 mg total) by mouth daily.  Dispense: 30 capsule; Refill: 3  General Counseling: jobin montelongo understanding of the findings of todays visit and agrees with plan of treatment. I have discussed any further diagnostic evaluation that may be needed or ordered today. We also reviewed his medications today. he has been encouraged to call the office with any questions or concerns that should arise related to todays visit.  Meds ordered this encounter  Medications   tamsulosin (FLOMAX) 0.4 MG CAPS capsule    Sig: Take 1 capsule (0.4 mg total) by mouth daily.    Dispense:  30 capsule    Refill:  3    Time spent: 30 Minutes Time spent includes review of chart, medications, test results and follow-up plan with the patient.  This patient was seen by Leeanne Deed AGNP-C in Collaboration with Dr Lyndon Code as a part of collaborative care agreement     Lubertha Basque. Jayanna Kroeger AGNP-C Internal medicine

## 2020-07-24 ENCOUNTER — Encounter: Payer: Self-pay | Admitting: Hospice and Palliative Medicine

## 2020-08-07 ENCOUNTER — Other Ambulatory Visit: Payer: Self-pay

## 2020-08-07 ENCOUNTER — Encounter (INDEPENDENT_AMBULATORY_CARE_PROVIDER_SITE_OTHER): Admitting: Internal Medicine

## 2020-08-07 DIAGNOSIS — G4733 Obstructive sleep apnea (adult) (pediatric): Secondary | ICD-10-CM

## 2020-08-07 MED ORDER — ATORVASTATIN CALCIUM 20 MG PO TABS: 20.0000 mg | ORAL_TABLET | Freq: Every day | ORAL | 1 refills | Status: DC

## 2020-08-12 NOTE — Procedures (Signed)
[facility]  Polysomnogram Report Part I                                                               Phone: [phone] Fax: [phone]  Patient Name: Roger Mclaughlin, Roger Mclaughlin Acquisition Number: [id number]  Date of Birth: [birth year] Acquisition Date: 08/07/2020  Referring Physician: Freda Munro, MD     History: The patient is a 59 year old male who was referred for evaluation of possible sleep apnea. Medical History: IBS, chronic sinusitis, constipation, GERD, hypothyroidism, hypercholesteremia.  Medications: Excedrin aspirin free, Lipitor, Librax, Nexium, Synthroid, magnesium, Miralax.  Procedure: This routine overnight polysomnogram was performed on the Alice 4 or 5 using the standard diagnostic protocol. This included 6 channels of EEG, 2 channels of EOG, chin EMG, bilateral anterior tibialis EMG, nasal/oral thermister, PTAF (nasal pressure transducer), chest and abdominal wall movements, EKG, pulse oximetry and EtCO2 when appropriate.  Description: The total recording time was 412.6 minutes. The total sleep time was 280.0 minutes. There were a total of 103.6 minutes of wakefulness after sleep onset for a ?reduced????sleep efficiency of 67.9%. The latency to sleep onset was ?within normal limits?? at 29.0 minutes. The R sleep onset latency was ??????prolonged at 162.0 minutes. Sleep parameters, as a percentage of the total sleep time, demonstrated 5.5% of sleep was in N1 sleep, 69.6% N2, 0.0% N3 and 24.8% R sleep. There were a total of 35 arousals for an arousal index of 7.5 arousals per hour of sleep that was normal.???  Respiratory monitoring demonstrated intermittent mild degree of snoring in all positions. There were 33 apneas and hypopneas for an Apnea Hypopnea Index of 7.1 apneas and hypopneas per hour of sleep. The REM related apnea hypopnea index was 25.0/hr of REM sleep compared to a NREM AHI of 1.1/hr. The Respiratory Disturbance Index, which includes 1 respiratory effort  related arousals (RERAs), was 7.3 respiratory events per hour of sleep.  The average duration of the respiratory events was 20.7 seconds with a maximum duration of 48.0 seconds. The respiratory events occurred exclusively in the supine position with an AHI of 12.0. The respiratory events were associated with peripheral oxygen desaturations on the average to 91%. The lowest oxygen desaturation associated with a respiratory event was 87%. Additionally, the baseline oxygen saturation during wakefulness was 97%, during NREM sleep averaged 95%, and during REM sleep averaged 95%. The total duration of oxygen < 90% was 0.9 minutes.  Cardiac monitoring- did not demonstrate transient cardiac decelerations associated with the apneas. There were no significant cardiac rhythm irregularities.   Periodic limb movement monitoring- did not demonstrate periodic limb movements.     Impression: ???This routine overnight polysomnogram demonstrated significant obstructive sleep apnea with an overall Apnea Hypopnea Index of 7.1 apneas and hypopneas per hour of sleep. The apnea occurred almost exclusively in supine, REM sleep.  There was a ?reduced sleep efficiency with??increased awakenings?and no slow wave sleep. The patient was awake for an extended period during the night.??????????????????  ?Recommendations:    1. This baseline polysomnogram demonstrates presence of MILD OSA with an overall AHI of 7.1 per hour. This was significantly elevated during REM sleep to an AHI of 25.0 per hour. 2. Minimal desaturation was noted to be 87% 3. A CPAP titration  would be recommended for the sleep apnea.  4. Would recommend weight loss in a patient with a BMI of 26.3.  5. Alternative treatment options may include: avoiding the supine sleep position, a nasal resistance device, an oral appliance or ENT surgery in the appropriate clinical context. ?    Yevonne Pax, MD, [facility] Diplomate ABMS Pulmonary, Critical Care  Medicine Sleep Medicine  Electronically reviewed and digitally signed      [facility] Polysomnogram Report Part II  Phone: [phone] Fax: [phone]  Patient last name Mclaughlin Neck Size    in. Acquisition [id number]  Patient first name Roger Weight 168.0 lbs. Started 08/07/2020 at 11:17:00 PM  Birth date 06/27/1961 Height 67.0 in. Stopped 08/08/2020 at 6:11:00 AM  Age 85 BMI 26.3 lb/in2 Duration 414.0  Study Type Adult      Report generated by: Richrd Sox. Heywood Footman, RPSGT Sleep Data: Lights Out: 11:17:00 PM Sleep Onset: 11:46:00 PM  Lights On: 6:11:00 AM Sleep Efficiency: 67.6 %  Total Recording Time: 414.0 min Sleep Latency (from Lights Off) 29.0 min  Total Sleep Time (TST): 280.0 min R Latency (from Sleep Onset): 162.0 min  Sleep Period Time: 383.5 min Total number of awakenings: 18  Wake during sleep: 103.5 min Wake After Sleep Onset (WASO): 105.0 min   Sleep Data:         Arousal Summary: Stage  Latency from lights out (min) Latency from sleep onset (min) Duration (min) % Total Sleep Time  Normal values  N 1 29.0 0.0 15.5 5.5 (5%)  N 2 30.5 1.5 195.0 69.6 (50%)  N 3       0.0 0.0 (20%)  R 191.0 162.0 69.5 24.8 (25%)    Number Index  Spontaneous 27 5.8  Apneas & Hypopneas 11 2.4  RERAs 1 0.2       (Apneas & Hypopneas & RERAs)  (12) (2.6)  Limb Movement 0 0.0  Snore 0 0.0  TOTAL 39 8.4      Respiratory Data:  CA OA MA Apnea Hypopnea* A+ H RERA Total  Number 0 13 0 13 20 33 1 34  Mean Dur (sec) 0.0 12.5 0.0 12.5 26.4 20.9 15.0 20.7  Max Dur (sec) 0.0 17.5 0.0 17.5 48.0 48.0 15.0 48.0  Total Dur (min) 0.0 2.7 0.0 2.7 8.8 11.5 0.3 11.8  % of TST 0.0 1.0 0.0 1.0 3.1 4.1 0.1 4.2  Index (#/h TST) 0.0 2.8 0.0 2.8 4.3 7.1 0.2 7.3  *Hypopneas scored based on 4% or greater desaturation.  Sleep Stage:        REM NREM TST  AHI 25.0 1.1 7.1  RDI 25.9 1.1 7.3            Body Position Data:  Sleep (min) TST (%) REM (min) NREM (min) CA (#)  OA (#) MA (#) HYP (#) AHI (#/h) RERA (#) RDI (#/h) Desat (#)  Supine 163.5 58.39 63.0 100.5 0 13 0 20 12.1 1 12.5 43  Non-Supine 116.50 41.61 6.50 110.00 0.00 0.00 0.00 0.00 0.00 0 0.00 2.00  Left: 42.0 15.00 0.0 42.0 0 0 0 0 0.0 0 0.00 0  Right: 74.5 26.61 6.5 68.0 0 0 0 0 0.0 0 0.00 2  UP: 0.0 0.00 0.0 0.0 0 0 0 0 0.0 0 0.00 0     Snoring: Total number of snoring episodes  0  Total time with snoring    min (   % of sleep)   Oximetry Distribution:  WK REM NREM TOTAL  Average (%)   97 95 95 96  < 90% 0.9 0.0 0.0 0.9  < 80% 0.4 0.0 0.0 0.4  < 70% 0.2 0.0 0.0 0.2  # of Desaturations* 5 32 8 45  Desat Index (#/hour) 2.4 27.6 2.3 9.6  Desat Max (%) 10 8 7 10   Desat Max Dur (sec) 29.0 69.0 85.0 85.0  Approx Min O2 during sleep 89  Approx min O2 during a respiratory event 87  Was Oxygen added (Y/N) and final rate No:   0 LPM  *Desaturations based on 3% or greater drop from baseline.   Cheyne Stokes Breathing: None Present    Heart Rate Summary:  Average Heart Rate During Sleep 78.5 bpm      Highest Heart Rate During Sleep (95th %) 92.0 bpm      Highest Heart Rate During Sleep 130 bpm      Highest Heart Rate During Recording (TIB) 223 bpm (artifact)   Heart Rate Observations: Event Type # Events   Bradycardia 0 Lowest HR Scored: N/A  Sinus Tachycardia During Sleep 0 Highest HR Scored: N/A  Narrow Complex Tachycardia 0 Highest HR Scored: N/A  Wide Complex Tachycardia 0 Highest HR Scored: N/A  Asystole 0 Longest Pause: N/A  Atrial Fibrillation 0 Duration Longest Event: N/A  Other Arrythmias  No Type:    Periodic Limb Movement Data: (Primary legs unless otherwise noted) Total # Limb Movement 0 Limb Movement Index 0.0  Total # PLMS    PLMS Index     Total # PLMS Arousals    PLMS Arousal Index     Percentage Sleep Time with PLMS   min (   % sleep)  Mean Duration limb movements (secs)

## 2020-08-22 ENCOUNTER — Other Ambulatory Visit: Payer: Self-pay

## 2020-08-22 MED ORDER — ESOMEPRAZOLE MAGNESIUM 40 MG PO CPDR
40.0000 mg | DELAYED_RELEASE_CAPSULE | Freq: Every day | ORAL | 3 refills | Status: DC
Start: 2020-08-22 — End: 2021-08-29

## 2020-10-03 ENCOUNTER — Other Ambulatory Visit: Payer: Self-pay

## 2020-10-03 MED ORDER — LEVOTHYROXINE SODIUM 50 MCG PO TABS: ORAL_TABLET | ORAL | 1 refills | Status: DC

## 2020-10-24 ENCOUNTER — Other Ambulatory Visit: Payer: Self-pay

## 2020-10-24 ENCOUNTER — Encounter: Payer: Self-pay | Admitting: Hospice and Palliative Medicine

## 2020-10-24 ENCOUNTER — Ambulatory Visit (INDEPENDENT_AMBULATORY_CARE_PROVIDER_SITE_OTHER): Admitting: Hospice and Palliative Medicine

## 2020-10-24 VITALS — BP 116/88 | HR 84 | Temp 97.8°F | Resp 16 | Ht 67.0 in | Wt 175.4 lb

## 2020-10-24 DIAGNOSIS — G4733 Obstructive sleep apnea (adult) (pediatric): Secondary | ICD-10-CM | POA: Diagnosis not present

## 2020-10-24 DIAGNOSIS — G8929 Other chronic pain: Secondary | ICD-10-CM | POA: Diagnosis not present

## 2020-10-24 DIAGNOSIS — R519 Headache, unspecified: Secondary | ICD-10-CM

## 2020-10-24 NOTE — Progress Notes (Signed)
[facility] [address] [place], [place] [address]  Internal MEDICINE  Office Visit Note  Patient Name: Roger Mclaughlin  [id number]  [id number]  Date of Service: 10/28/2020  Chief Complaint  Patient presents with  . Follow-up  . Gastroesophageal Reflux  . Hyperlipidemia    HPI Patient is here for routine follow-up Continues to have daily headaches each morning when he wakes up Has been taking Excedrin Migraine daily for many years--has tried multiple alternative therapies which do not provide relief from his headaches Allergy testing insignificant findings, CT sinuses with trace thickening He did have a sleep study--AHI 7.1, evidence of mild OSA, REM AHI 25, lowest SpO2 87%  History of IBS-C, continues to have issues with constipation but is able to control his symptoms  Current Medication: Outpatient Encounter Medications as of 10/24/2020  Medication Sig  . Acetaminophen-Caffeine (EXCEDRIN ASPIRIN FREE PO) Take by mouth daily.  Marland Kitchen atorvastatin (LIPITOR) 20 MG tablet Take 1 tablet (20 mg total) by mouth at bedtime.  . clidinium-chlordiazePOXIDE (LIBRAX) 5-2.5 MG capsule Take 1 capsule by mouth 3 (three) times daily before meals.  Marland Kitchen esomeprazole (NEXIUM) 40 MG capsule Take 1 capsule (40 mg total) by mouth daily at 12 noon.  Marland Kitchen levothyroxine (SYNTHROID) 50 MCG tablet TAKE 1 TABLET EVERY MORNING BEFORE BREAKFAST  . Magnesium 500 MG TABS Take 2 tablets by mouth daily.  . polyethylene glycol powder (MIRALAX) powder Take 1 Container by mouth every evening.  . [DISCONTINUED] tamsulosin (FLOMAX) 0.4 MG CAPS capsule Take 1 capsule (0.4 mg total) by mouth daily.   No facility-administered encounter medications on file as of 10/24/2020.    Surgical History: Past Surgical History:  Procedure Laterality Date  . COLONOSCOPY    . COLONOSCOPY WITH PROPOFOL N/A 01/23/2017   Procedure: COLONOSCOPY WITH PROPOFOL;  Surgeon: Wyline Mood, MD;  Location: [facility] ENDOSCOPY;  Service:  Endoscopy;  Laterality: N/A;  . ESOPHAGOGASTRODUODENOSCOPY    . WISDOM TOOTH EXTRACTION      Medical History: Past Medical History:  Diagnosis Date  . Chronic sinusitis   . Constipation   . GERD (gastroesophageal reflux disease)   . Hypercholesteremia   . Hypothyroidism   . Irritable bowel syndrome     Family History: Family History  Problem Relation Age of Onset  . Irritable bowel syndrome Mother   . Hypertension Mother   . Diabetes Mother   . Prostate cancer Father   . Hypertension Father   . Diabetes Father   . Irritable bowel syndrome Sister     Social History   Socioeconomic History  . Marital status: Single    Spouse name: Not on file  . Number of children: Not on file  . Years of education: Not on file  . Highest education level: Not on file  Occupational History  . Not on file  Tobacco Use  . Smoking status: Never Smoker  . Smokeless tobacco: Never Used  Vaping Use  . Vaping Use: Never used  Substance and Sexual Activity  . Alcohol use: Yes    Alcohol/week: 7.0 standard drinks    Types: 7 Glasses of wine per week    Comment: 1 glass of wine a day  . Drug use: No  . Sexual activity: Yes    Birth control/protection: Condom  Other Topics Concern  . Not on file  Social History Narrative  . Not on file   Social Determinants of Health   Financial Resource Strain: Not on file  Food Insecurity: Not on  file  Transportation Needs: Not on file  Physical Activity: Not on file  Stress: Not on file  Social Connections: Not on file  Intimate Partner Violence: Not on file      Review of Systems  Constitutional: Negative for chills, fatigue and unexpected weight change.  HENT: Negative for congestion, postnasal drip, rhinorrhea, sneezing and sore throat.   Eyes: Negative for redness.  Respiratory: Negative for cough, chest tightness and shortness of breath.   Cardiovascular: Negative for chest pain and palpitations.  Gastrointestinal: Positive for  abdominal pain and constipation. Negative for diarrhea, nausea and vomiting.  Genitourinary: Negative for dysuria and frequency.  Musculoskeletal: Negative for arthralgias, back pain, joint swelling and neck pain.  Skin: Negative for rash.  Neurological: Positive for headaches. Negative for tremors and numbness.  Hematological: Negative for adenopathy. Does not bruise/bleed easily.  Psychiatric/Behavioral: Negative for behavioral problems (Depression), sleep disturbance and suicidal ideas. The patient is not nervous/anxious.     Vital Signs: BP 116/88   Pulse 84   Temp 97.8 F (36.6 C)   Resp 16   Ht 5\' 7"  (1.702 m)   Wt 175 lb 6.4 oz (79.6 kg)   SpO2 99%   BMI 27.47 kg/m    Physical Exam Vitals reviewed.  Constitutional:      Appearance: Normal appearance. He is normal weight.  Cardiovascular:     Rate and Rhythm: Normal rate and regular rhythm.     Pulses: Normal pulses.     Heart sounds: Normal heart sounds.  Pulmonary:     Effort: Pulmonary effort is normal.     Breath sounds: Normal breath sounds.  Abdominal:     General: Abdomen is flat.     Palpations: Abdomen is soft.  Musculoskeletal:        General: Normal range of motion.     Cervical back: Normal range of motion.  Skin:    General: Skin is warm.  Neurological:     General: No focal deficit present.     Mental Status: He is alert and oriented to person, place, and time.  Psychiatric:        Mood and Affect: Mood normal.        Behavior: Behavior normal.        Thought Content: Thought content normal.        Judgment: Judgment normal.    Assessment/Plan: 1. OSA (obstructive sleep apnea) APAP ordered for mild OSA with AHI 7.1 Will follow-up after CPAP set up - For home use only DME continuous positive airway pressure (CPAP)  2. Chronic intractable headache, unspecified headache type Referral placed to neurology for daily headaches, imaging and testing insignificant causes of headaches, need  assistance with treatment management to wean off Excedrin daily - Ambulatory referral to Neurology  General Counseling: lavell supple understanding of the findings of todays visit and agrees with plan of treatment. I have discussed any further diagnostic evaluation that may be needed or ordered today. We also reviewed his medications today. he has been encouraged to call the office with any questions or concerns that should arise related to todays visit.    Orders Placed This Encounter  Procedures  . For home use only DME continuous positive airway pressure (CPAP)  . Ambulatory referral to Neurology    Time spent: 30 Minutes Time spent includes review of chart, medications, test results and follow-up plan with the patient.  This patient was seen by Donzetta Starch AGNP-C in Collaboration with Dr Leeanne Deed  as a part of collaborative care agreement     Lubertha Basque. Earla Charlie AGNP-C Internal medicine

## 2020-10-25 ENCOUNTER — Telehealth: Payer: Self-pay

## 2020-10-25 NOTE — Telephone Encounter (Signed)
Gave [facility] orders for new cpap set up. They will call once pt is approved and ready to pickup machine and supplies. Ingra Rother

## 2020-10-28 ENCOUNTER — Encounter: Payer: Self-pay | Admitting: Hospice and Palliative Medicine

## 2020-11-22 ENCOUNTER — Other Ambulatory Visit: Payer: Self-pay

## 2020-11-22 MED ORDER — LEVOTHYROXINE SODIUM 50 MCG PO TABS: ORAL_TABLET | ORAL | 1 refills | Status: DC

## 2020-12-26 DIAGNOSIS — R519 Headache, unspecified: Secondary | ICD-10-CM | POA: Insufficient documentation

## 2021-02-15 ENCOUNTER — Other Ambulatory Visit: Payer: Self-pay | Admitting: Nurse Practitioner

## 2021-04-06 ENCOUNTER — Other Ambulatory Visit: Payer: Self-pay

## 2021-04-06 MED ORDER — ATORVASTATIN CALCIUM 20 MG PO TABS: 20.0000 mg | ORAL_TABLET | Freq: Every day | ORAL | 1 refills | Status: DC

## 2021-04-26 ENCOUNTER — Ambulatory Visit (INDEPENDENT_AMBULATORY_CARE_PROVIDER_SITE_OTHER): Admitting: Nurse Practitioner

## 2021-04-26 ENCOUNTER — Other Ambulatory Visit: Payer: Self-pay

## 2021-04-26 ENCOUNTER — Encounter: Payer: Self-pay | Admitting: Nurse Practitioner

## 2021-04-26 VITALS — BP 126/74 | HR 90 | Temp 98.5°F | Resp 16 | Ht 67.0 in | Wt 173.0 lb

## 2021-04-26 DIAGNOSIS — R300 Dysuria: Secondary | ICD-10-CM

## 2021-04-26 DIAGNOSIS — Z125 Encounter for screening for malignant neoplasm of prostate: Secondary | ICD-10-CM

## 2021-04-26 DIAGNOSIS — K219 Gastro-esophageal reflux disease without esophagitis: Secondary | ICD-10-CM | POA: Diagnosis not present

## 2021-04-26 DIAGNOSIS — E039 Hypothyroidism, unspecified: Secondary | ICD-10-CM | POA: Diagnosis not present

## 2021-04-26 DIAGNOSIS — E559 Vitamin D deficiency, unspecified: Secondary | ICD-10-CM

## 2021-04-26 DIAGNOSIS — Z0001 Encounter for general adult medical examination with abnormal findings: Secondary | ICD-10-CM

## 2021-04-26 DIAGNOSIS — N1831 Chronic kidney disease, stage 3a: Secondary | ICD-10-CM

## 2021-04-26 DIAGNOSIS — K581 Irritable bowel syndrome with constipation: Secondary | ICD-10-CM

## 2021-04-26 DIAGNOSIS — E782 Mixed hyperlipidemia: Secondary | ICD-10-CM

## 2021-04-26 NOTE — Progress Notes (Signed)
[facility] [place], [place] [address]  Internal MEDICINE  Office Visit Note  Patient Name: Roger Mclaughlin  [id number]  [id number]  Date of Service: 04/26/2021  Chief Complaint  Patient presents with   Annual Exam   Hypothyroidism    HPI [place] presents for an annual well visit and physical exam. he has a history of hypothyroidism, GERD, IBS predominantly constipation, chronic kidney disease stage 3a. Colonoscopy was done in 2018, which he gets routinely every 5 years due to GI problems. He has received 2 doses of the COVID vaccine, no booster dose, and is not ready to get the booster dose at this time. Patient is declining shingles vaccine for now but will consider it later. He lives at home alone, he is not married and has no children. 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  He denies any recreation drug use, is a nonsmoker and drinks 1 8 oz glass of red wine daily for the antioxidants. His last colonoscopy was in 2018, he is due in may 2023 for screening colonoscopy.    Current Medication: Outpatient Encounter Medications as of 04/26/2021  Medication Sig   Acetaminophen-Caffeine (EXCEDRIN ASPIRIN FREE PO) Take by mouth daily. (Patient not taking: Reported on 04/26/2021)   atorvastatin (LIPITOR) 20 MG tablet Take 1 tablet (20 mg total) by mouth at bedtime.   clidinium-chlordiazePOXIDE (LIBRAX) 5-2.5 MG capsule Take 1 capsule by mouth 3 (three) times daily before meals.   esomeprazole (NEXIUM) 40 MG capsule Take 1 capsule (40 mg total) by mouth daily at 12 noon.   levothyroxine (SYNTHROID) 50 MCG tablet TAKE 1 TABLET EVERY MORNING BEFORE BREAKFAST   Magnesium 500 MG TABS Take 2 tablets by mouth daily.   polyethylene glycol powder (MIRALAX) powder Take 1 Container by mouth every evening.   No facility-administered encounter medications on file as of 04/26/2021.     Surgical History: Past Surgical History:  Procedure Laterality Date   COLONOSCOPY     COLONOSCOPY WITH PROPOFOL N/A 01/23/2017   Procedure: COLONOSCOPY WITH PROPOFOL;  Surgeon: Jonathon Bellows, MD;  Location: [facility] ENDOSCOPY;  Service: Endoscopy;  Laterality: N/A;   ESOPHAGOGASTRODUODENOSCOPY     WISDOM TOOTH EXTRACTION      Medical History: Past Medical History:  Diagnosis Date   Chronic sinusitis    Constipation    GERD (gastroesophageal reflux disease)    Hypercholesteremia    Hypothyroidism    Irritable bowel syndrome     Family History: Family History  Problem Relation Age of Onset   Irritable bowel syndrome Mother    Hypertension Mother    Diabetes Mother    Prostate cancer Father    Hypertension Father    Diabetes Father    Irritable bowel syndrome Sister     Social History   Socioeconomic History   Marital status: Single    Spouse name: Not on file   Number of children: Not on file   Years of education: Not on file   Highest education level: Not on file  Occupational History   Not on file  Tobacco Use   Smoking status: Never   Smokeless tobacco: Never  Vaping Use   Vaping Use: Never used  Substance and Sexual Activity   Alcohol use: Yes    Alcohol/week: 7.0 standard drinks    Types: 7 Glasses of wine per week    Comment: 1 glass of  wine a day   Drug use: No   Sexual activity: Yes    Birth control/protection: Condom  Other Topics Concern   Not on file  Social History Narrative   Not on file   Social Determinants of Health   Financial Resource Strain: Not on file  Food Insecurity: Not on file  Transportation Needs: Not on file  Physical Activity: Not on file  Stress: Not on file  Social Connections: Not on file  Intimate Partner Violence: Not on file      Review of Systems  Constitutional:  Negative for activity change, appetite change, chills, fatigue, fever and unexpected weight change.  HENT: Negative.  Negative for congestion, ear  pain, rhinorrhea, sore throat and trouble swallowing.   Eyes: Negative.   Respiratory: Negative.  Negative for cough, chest tightness, shortness of breath and wheezing.   Cardiovascular: Negative.  Negative for chest pain.  Gastrointestinal: Negative.  Negative for abdominal pain, blood in stool, constipation, diarrhea, nausea and vomiting.  Endocrine: Negative.   Genitourinary: Negative.  Negative for difficulty urinating, dysuria, frequency, hematuria and urgency.  Musculoskeletal: Negative.  Negative for arthralgias, back pain, joint swelling, myalgias and neck pain.  Skin: Negative.  Negative for rash and wound.  Allergic/Immunologic: Negative.  Negative for immunocompromised state.  Neurological: Negative.  Negative for dizziness, seizures, numbness and headaches.  Hematological: Negative.   Psychiatric/Behavioral: Negative.  Negative for behavioral problems, self-injury and suicidal ideas. The patient is not nervous/anxious.    Vital Signs: BP 126/74   Pulse 90   Temp 98.5 F (36.9 C)   Resp 16   Ht '5\' 7"'  (1.702 m)   Wt 173 lb (78.5 kg)   SpO2 99%   BMI 27.10 kg/m    Physical Exam Vitals reviewed.  Constitutional:      General: He is awake. He is not in acute distress.    Appearance: Normal appearance. He is well-developed, well-groomed and overweight. He is not ill-appearing.  HENT:     Head: Normocephalic and atraumatic.     Right Ear: Tympanic membrane, ear canal and external ear normal.     Left Ear: Tympanic membrane, ear canal and external ear normal.     Nose: Nose normal. No congestion or rhinorrhea.     Mouth/Throat:     Mouth: Mucous membranes are moist.     Pharynx: Oropharynx is clear. No oropharyngeal exudate or posterior oropharyngeal erythema.  Eyes:     General: Lids are normal. Vision grossly intact. Gaze aligned appropriately.     Extraocular Movements: Extraocular movements intact.     Conjunctiva/sclera: Conjunctivae normal.     Pupils: Pupils are  equal, round, and reactive to light.     Funduscopic exam:    Right eye: Red reflex present.        Left eye: Red reflex present. Neck:     Vascular: No carotid bruit.  Cardiovascular:     Rate and Rhythm: Normal rate and regular rhythm.     Pulses: Normal pulses.     Heart sounds: Normal heart sounds. No murmur heard. Pulmonary:     Effort: Pulmonary effort is normal. No respiratory distress.     Breath sounds: Normal breath sounds. No wheezing.  Abdominal:     General: Bowel sounds are normal.     Palpations: Abdomen is soft.  Musculoskeletal:        General: Normal range of motion.     Cervical back: Normal range of motion and neck supple.  Lymphadenopathy:     Cervical: No cervical adenopathy.  Skin:    General: Skin is warm and dry.     Capillary Refill: Capillary refill takes less than 2 seconds.  Neurological:     Mental Status: He is alert and oriented to person, place, and time.  Psychiatric:        Mood and Affect: Mood normal.        Behavior: Behavior normal. Behavior is cooperative.        Thought Content: Thought content normal.        Judgment: Judgment normal.    Assessment/Plan: 1. Encounter for routine adult health examination with abnormal findings Age-appropriate preventive screenings and vaccinations discussed, annual physical exam completed. Routine labs for health maintenance ordered, see below. PHM updated.   2. Acquired hypothyroidism Stable, no refills needed, recheck thyroid levels - TSH + free T4  3. Stage 3a chronic kidney disease (HCC) Chronically elevated creatinine and decrease GFR, recheck labs to monitor.  - CBC with Differential/Platelet - CMP14+EGFR  4. Gastroesophageal reflux disease without esophagitis Stable with current medication, no refills needed  5. Irritable bowel syndrome with constipation Manageable with current medication, followed by GI.  6. Mixed hyperlipidemia Takes atorvastatin, last lipid panel was wnl except  LDL 112. Recheck fasting lipid panel.  - Lipid Profile  7. Vitamin D deficiency Rule out vitamin D deficiency - Vitamin D (25 hydroxy)  8. Dysuria Routine urinalysis done. - UA/M w/rflx Culture, Routine   General Counseling: sehaj mcenroe understanding of the findings of todays visit and agrees with plan of treatment. I have discussed any further diagnostic evaluation that may be needed or ordered today. We also reviewed his medications today. he has been encouraged to call the office with any questions or concerns that should arise related to todays visit.    Orders Placed This Encounter  Procedures   CBC with Differential/Platelet   CMP14+EGFR   Lipid Profile   Vitamin D (25 hydroxy)   TSH + free T4   UA/M w/rflx Culture, Routine    No orders of the defined types were placed in this encounter.   Return in about 6 months (around 10/27/2021) for F/U, med refill, Ayan Yankey PCP.   Total time spent:30 Minutes Time spent includes review of chart, medications, test results, and follow up plan with the patient.   [place] Controlled Substance Database was reviewed by me.  This patient was seen by Jonetta Osgood, FNP-C in collaboration with Dr. Clayborn Bigness as a part of collaborative care agreement.  Alynah Schone R. Valetta Fuller, MSN, FNP-C Internal medicine

## 2021-04-27 LAB — MICROSCOPIC EXAMINATION
Bacteria, UA: NONE SEEN
Casts: NONE SEEN /lpf
Epithelial Cells (non renal): NONE SEEN /hpf (ref 0–10)
RBC, Urine: NONE SEEN /hpf (ref 0–2)
WBC, UA: NONE SEEN /hpf (ref 0–5)

## 2021-04-27 LAB — UA/M W/RFLX CULTURE, ROUTINE
Bilirubin, UA: NEGATIVE
Glucose, UA: NEGATIVE
Ketones, UA: NEGATIVE
Leukocytes,UA: NEGATIVE
Nitrite, UA: NEGATIVE
Protein,UA: NEGATIVE
RBC, UA: NEGATIVE
Specific Gravity, UA: 1.012 (ref 1.005–1.030)
Urobilinogen, Ur: 0.2 mg/dL (ref 0.2–1.0)
pH, UA: 7.5 (ref 5.0–7.5)

## 2021-05-17 LAB — CBC WITH DIFFERENTIAL/PLATELET
Basophils Absolute: 0 10*3/uL (ref 0.0–0.2)
Basos: 1 %
EOS (ABSOLUTE): 0.1 10*3/uL (ref 0.0–0.4)
Eos: 3 %
Hematocrit: 43.7 % (ref 37.5–51.0)
Hemoglobin: 15.1 g/dL (ref 13.0–17.7)
Immature Grans (Abs): 0 10*3/uL (ref 0.0–0.1)
Immature Granulocytes: 0 %
Lymphocytes Absolute: 1.5 10*3/uL (ref 0.7–3.1)
Lymphs: 41 %
MCH: 29.3 pg (ref 26.6–33.0)
MCHC: 34.6 g/dL (ref 31.5–35.7)
MCV: 85 fL (ref 79–97)
Monocytes Absolute: 0.3 10*3/uL (ref 0.1–0.9)
Monocytes: 9 %
Neutrophils Absolute: 1.7 10*3/uL (ref 1.4–7.0)
Neutrophils: 46 %
Platelets: 171 10*3/uL (ref 150–450)
RBC: 5.15 x10E6/uL (ref 4.14–5.80)
RDW: 12.3 % (ref 11.6–15.4)
WBC: 3.7 10*3/uL (ref 3.4–10.8)

## 2021-05-17 LAB — LIPID PANEL
Chol/HDL Ratio: 2.8 ratio (ref 0.0–5.0)
Cholesterol, Total: 188 mg/dL (ref 100–199)
HDL: 66 mg/dL (ref >39)
LDL Chol Calc (NIH): 106 mg/dL — ABNORMAL HIGH (ref 0–99)
Triglycerides: 88 mg/dL (ref 0–149)
VLDL Cholesterol Cal: 16 mg/dL (ref 5–40)

## 2021-05-17 LAB — CMP14+EGFR
ALT: 37 IU/L (ref 0–44)
AST: 29 IU/L (ref 0–40)
Albumin/Globulin Ratio: 2 (ref 1.2–2.2)
Albumin: 4.5 g/dL (ref 3.8–4.9)
Alkaline Phosphatase: 89 IU/L (ref 44–121)
BUN/Creatinine Ratio: 10 (ref 9–20)
BUN: 13 mg/dL (ref 6–24)
Bilirubin Total: 0.6 mg/dL (ref 0.0–1.2)
CO2: 20 mmol/L (ref 20–29)
Calcium: 10 mg/dL (ref 8.7–10.2)
Chloride: 97 mmol/L (ref 96–106)
Creatinine, Ser: 1.34 mg/dL — ABNORMAL HIGH (ref 0.76–1.27)
Globulin, Total: 2.3 g/dL (ref 1.5–4.5)
Glucose: 97 mg/dL (ref 65–99)
Potassium: 4.5 mmol/L (ref 3.5–5.2)
Sodium: 137 mmol/L (ref 134–144)
Total Protein: 6.8 g/dL (ref 6.0–8.5)
eGFR: 61 mL/min/{1.73_m2} (ref >59)

## 2021-05-17 LAB — TSH+FREE T4
Free T4: 1.07 ng/dL (ref 0.82–1.77)
TSH: 0.915 u[IU]/mL (ref 0.450–4.500)

## 2021-05-17 LAB — VITAMIN D 25 HYDROXY (VIT D DEFICIENCY, FRACTURES): Vit D, 25-Hydroxy: 64.6 ng/mL (ref 30.0–100.0)

## 2021-05-17 LAB — PSA: Prostate Specific Ag, Serum: 1 ng/mL (ref 0.0–4.0)

## 2021-06-01 ENCOUNTER — Other Ambulatory Visit: Payer: Self-pay

## 2021-06-01 MED ORDER — LEVOTHYROXINE SODIUM 50 MCG PO TABS: ORAL_TABLET | ORAL | 1 refills | Status: DC

## 2021-08-20 ENCOUNTER — Other Ambulatory Visit: Payer: Self-pay | Admitting: Nurse Practitioner

## 2021-08-29 ENCOUNTER — Ambulatory Visit: Admitting: Nurse Practitioner

## 2021-08-29 ENCOUNTER — Encounter: Payer: Self-pay | Admitting: Nurse Practitioner

## 2021-08-29 ENCOUNTER — Other Ambulatory Visit: Payer: Self-pay

## 2021-08-29 VITALS — BP 118/75 | HR 93 | Temp 98.3°F | Resp 16 | Ht 67.5 in | Wt 172.6 lb

## 2021-08-29 DIAGNOSIS — E782 Mixed hyperlipidemia: Secondary | ICD-10-CM | POA: Diagnosis not present

## 2021-08-29 DIAGNOSIS — J0110 Acute frontal sinusitis, unspecified: Secondary | ICD-10-CM | POA: Diagnosis not present

## 2021-08-29 DIAGNOSIS — K219 Gastro-esophageal reflux disease without esophagitis: Secondary | ICD-10-CM | POA: Diagnosis not present

## 2021-08-29 DIAGNOSIS — E039 Hypothyroidism, unspecified: Secondary | ICD-10-CM

## 2021-08-29 DIAGNOSIS — K581 Irritable bowel syndrome with constipation: Secondary | ICD-10-CM

## 2021-08-29 MED ORDER — ATORVASTATIN CALCIUM 20 MG PO TABS: 20.0000 mg | ORAL_TABLET | Freq: Every day | ORAL | 1 refills | Status: DC

## 2021-08-29 MED ORDER — LEVOTHYROXINE SODIUM 50 MCG PO TABS: ORAL_TABLET | ORAL | 1 refills | Status: DC

## 2021-08-29 MED ORDER — AMOXICILLIN-POT CLAVULANATE 875-125 MG PO TABS: 1.0000 | ORAL_TABLET | Freq: Two times a day (BID) | ORAL | 0 refills | Status: DC

## 2021-08-29 MED ORDER — ESOMEPRAZOLE MAGNESIUM 40 MG PO CPDR: 40.0000 mg | DELAYED_RELEASE_CAPSULE | Freq: Every day | ORAL | 3 refills | Status: DC

## 2021-08-29 MED ORDER — CILIDINIUM-CHLORDIAZEPOXIDE 2.5-5 MG PO CAPS: 1.0000 | ORAL_CAPSULE | Freq: Three times a day (TID) | ORAL | 5 refills | Status: DC

## 2021-08-29 NOTE — Progress Notes (Signed)
[facility] [address] [place], [place] [address]  Internal MEDICINE  Office Visit Note  Patient Name: Roger Mclaughlin  [id number]  [id number]  Date of Service: 08/29/2021  Chief Complaint  Patient presents with   Acute Visit    Neg. Covid test   Sinusitis    Started Friday, drainage   Headache   Medication Refill     HPI Areeb presents for an acute sick visit for symptoms of sinusitis. He had a negative home covid test. His symptoms started this past Friday. He reports headache, nasal congestion, postnasal drip, runny nose, sinus pressure and cough. He also needs medication refills.    Current Medication:  Outpatient Encounter Medications as of 08/29/2021  Medication Sig   amoxicillin-clavulanate (AUGMENTIN) 875-125 MG tablet Take 1 tablet by mouth 2 (two) times daily.   Magnesium 500 MG TABS Take 2 tablets by mouth daily.   nortriptyline (PAMELOR) 10 MG capsule    polyethylene glycol powder (GLYCOLAX/MIRALAX) 17 GM/SCOOP powder Take 1 Container by mouth every evening.   [DISCONTINUED] atorvastatin (LIPITOR) 20 MG tablet Take 1 tablet (20 mg total) by mouth at bedtime.   [DISCONTINUED] clidinium-chlordiazePOXIDE (LIBRAX) 5-2.5 MG capsule Take 1 capsule by mouth 3 (three) times daily before meals.   [DISCONTINUED] esomeprazole (NEXIUM) 40 MG capsule Take 1 capsule (40 mg total) by mouth daily at 12 noon.   [DISCONTINUED] levothyroxine (SYNTHROID) 50 MCG tablet TAKE 1 TABLET EVERY MORNING BEFORE BREAKFAST   atorvastatin (LIPITOR) 20 MG tablet Take 1 tablet (20 mg total) by mouth at bedtime.   clidinium-chlordiazePOXIDE (LIBRAX) 5-2.5 MG capsule Take 1 capsule by mouth 3 (three) times daily before meals.   esomeprazole (NEXIUM) 40 MG capsule Take 1 capsule (40 mg total) by mouth daily at 12 noon.   levothyroxine (SYNTHROID) 50 MCG tablet TAKE 1 TABLET EVERY MORNING BEFORE BREAKFAST   [DISCONTINUED] Acetaminophen-Caffeine (EXCEDRIN ASPIRIN FREE PO) Take by mouth  daily. (Patient not taking: Reported on 04/26/2021)   No facility-administered encounter medications on file as of 08/29/2021.      Medical History: Past Medical History:  Diagnosis Date   Chronic sinusitis    Constipation    GERD (gastroesophageal reflux disease)    Hypercholesteremia    Hypothyroidism    Irritable bowel syndrome      Vital Signs: BP 118/75    Pulse 93    Temp 98.3 F (36.8 C)    Resp 16    Ht 5' 7.5" (1.715 m)    Wt 172 lb 9.6 oz (78.3 kg)    SpO2 97%    BMI 26.63 kg/m    Review of Systems  Constitutional:  Negative for chills, fatigue and fever.  HENT:  Positive for congestion, ear pain, postnasal drip, rhinorrhea, sinus pressure, sinus pain, sneezing and sore throat. Negative for trouble swallowing.   Respiratory:  Positive for cough. Negative for chest tightness, shortness of breath and wheezing.   Cardiovascular:  Negative for chest pain and palpitations.  Gastrointestinal:  Negative for abdominal pain, constipation, diarrhea, nausea and vomiting.  Musculoskeletal:  Negative for myalgias.  Skin:  Negative for rash.  Neurological:  Positive for headaches.   Physical Exam Vitals reviewed.  Constitutional:      General: He is not in acute distress.    Appearance: Normal appearance. He is normal weight. He is not ill-appearing.  HENT:     Head: Normocephalic and atraumatic.     Right Ear: Tympanic membrane, ear canal and external ear normal.  Left Ear: Tympanic membrane, ear canal and external ear normal.     Nose: Congestion and rhinorrhea present.     Mouth/Throat:     Pharynx: Posterior oropharyngeal erythema present. No oropharyngeal exudate.  Eyes:     Pupils: Pupils are equal, round, and reactive to light.  Cardiovascular:     Rate and Rhythm: Regular rhythm. Tachycardia present.     Pulses: Normal pulses.     Heart sounds: Normal heart sounds.  Pulmonary:     Effort: Pulmonary effort is normal. No respiratory distress.     Breath  sounds: Normal breath sounds. No wheezing.  Lymphadenopathy:     Cervical: No cervical adenopathy.  Neurological:     Mental Status: He is alert and oriented to person, place, and time.     Cranial Nerves: No cranial nerve deficit.     Coordination: Coordination normal.     Gait: Gait normal.  Psychiatric:        Mood and Affect: Mood normal.        Behavior: Behavior normal.      Assessment/Plan: 1. Acute non-recurrent frontal sinusitis Empiric antibiotic treatment prescribed.  - amoxicillin-clavulanate (AUGMENTIN) 875-125 MG tablet; Take 1 tablet by mouth 2 (two) times daily.  Dispense: 20 tablet; Refill: 0  2. Acquired hypothyroidism Stable, refills ordered.  - levothyroxine (SYNTHROID) 50 MCG tablet; TAKE 1 TABLET EVERY MORNING BEFORE BREAKFAST  Dispense: 90 tablet; Refill: 1  3. Mixed hyperlipidemia Continue as prescribed, refills ordered.  - atorvastatin (LIPITOR) 20 MG tablet; Take 1 tablet (20 mg total) by mouth at bedtime.  Dispense: 90 tablet; Refill: 1  4. Gastroesophageal reflux disease without esophagitis Stable, continue as prescribed, refills ordered. - esomeprazole (NEXIUM) 40 MG capsule; Take 1 capsule (40 mg total) by mouth daily at 12 noon.  Dispense: 90 capsule; Refill: 3  5. Irritable bowel syndrome with constipation Refills ordered, continue as prescribed.  - clidinium-chlordiazePOXIDE (LIBRAX) 5-2.5 MG capsule; Take 1 capsule by mouth 3 (three) times daily before meals.  Dispense: 90 capsule; Refill: 5   General Counseling: collin rengel understanding of the findings of todays visit and agrees with plan of treatment. I have discussed any further diagnostic evaluation that may be needed or ordered today. We also reviewed his medications today. he has been encouraged to call the office with any questions or concerns that should arise related to todays visit.    Counseling:    No orders of the defined types were placed in this  encounter.    Meds ordered this encounter  Medications   amoxicillin-clavulanate (AUGMENTIN) 875-125 MG tablet    Sig: Take 1 tablet by mouth 2 (two) times daily.    Dispense:  20 tablet    Refill:  0   clidinium-chlordiazePOXIDE (LIBRAX) 5-2.5 MG capsule    Sig: Take 1 capsule by mouth 3 (three) times daily before meals.    Dispense:  90 capsule    Refill:  5   esomeprazole (NEXIUM) 40 MG capsule    Sig: Take 1 capsule (40 mg total) by mouth daily at 12 noon.    Dispense:  90 capsule    Refill:  3   atorvastatin (LIPITOR) 20 MG tablet    Sig: Take 1 tablet (20 mg total) by mouth at bedtime.    Dispense:  90 tablet    Refill:  1   levothyroxine (SYNTHROID) 50 MCG tablet    Sig: TAKE 1 TABLET EVERY MORNING BEFORE BREAKFAST    Dispense:  90  tablet    Refill:  1    Return if symptoms worsen or fail to improve.  [place] Controlled Substance Database was reviewed by me for overdose risk score (ORS)  Time spent:30 Minutes Time spent with patient included reviewing progress notes, labs, imaging studies, and discussing plan for follow up.   This patient was seen by Sallyanne Kuster, FNP-C in collaboration with Dr. Beverely Risen as a part of collaborative care agreement.  Mead Slane R. Tedd Sias, MSN, FNP-C Internal Medicine

## 2021-09-30 ENCOUNTER — Encounter: Payer: Self-pay | Admitting: Nurse Practitioner

## 2021-10-13 IMAGING — CT CT MAXILLOFACIAL W/O CM
1 of 2 series · 16 of 30 positions shown, 20 images · non-contrast
Comparison: None.

CLINICAL DATA: Headaches

EXAM:
CT MAXILLOFACIAL WITHOUT CONTRAST
TECHNIQUE: Multidetector CT images of the paranasal sinuses were obtained using
the standard protocol without intravenous contrast.

[Series 6: (person_name) · axial · 0.42mm/px · z∈[+60,+222]mm · 16 of 182 slices shown, 20 images]
[im 10/182  brain]
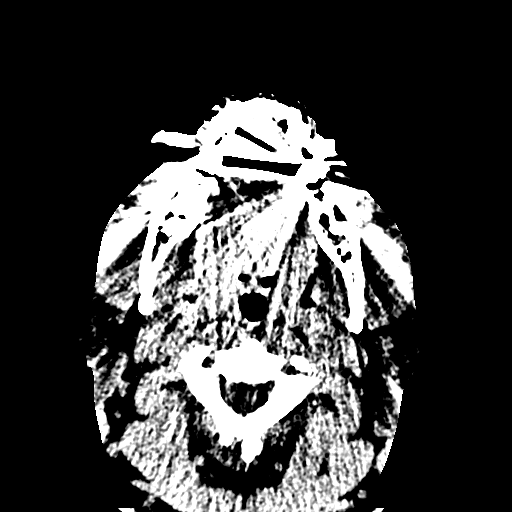
[im 10/182  bone]
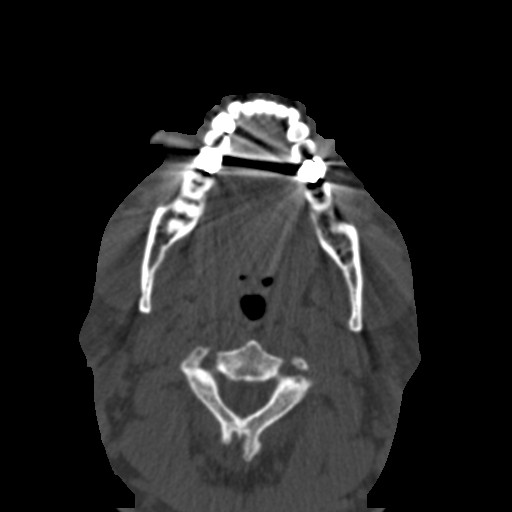
[im 20/182  bone]
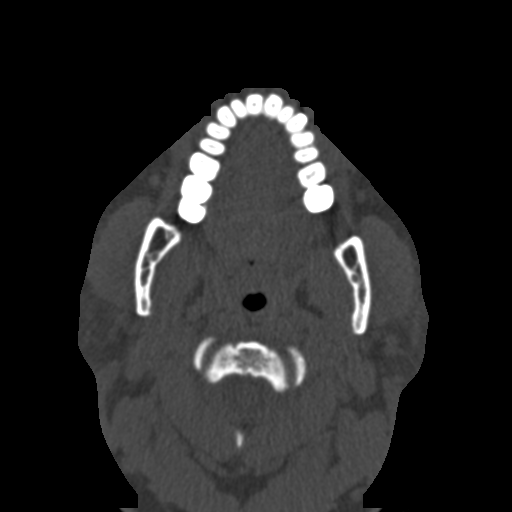
[im 29/182  bone]
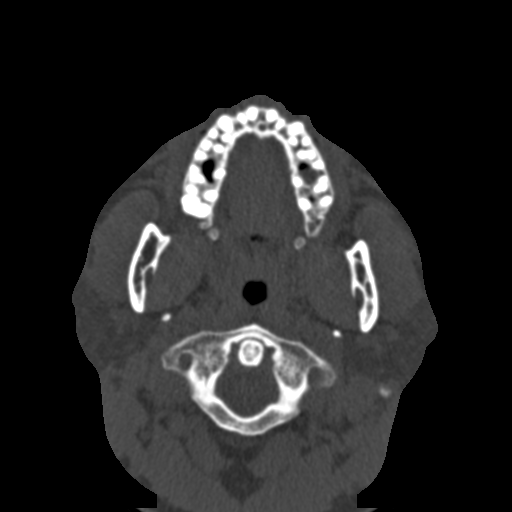
[im 39/182  bone]
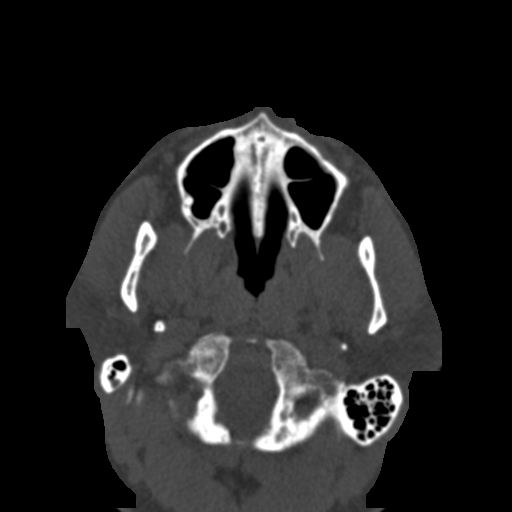
[im 58/182  brain]
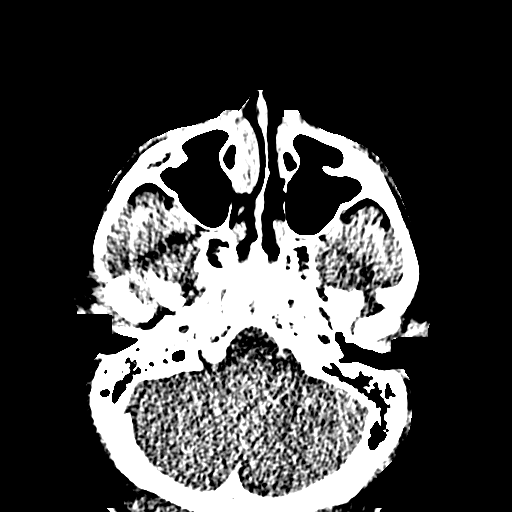
[im 58/182  bone]
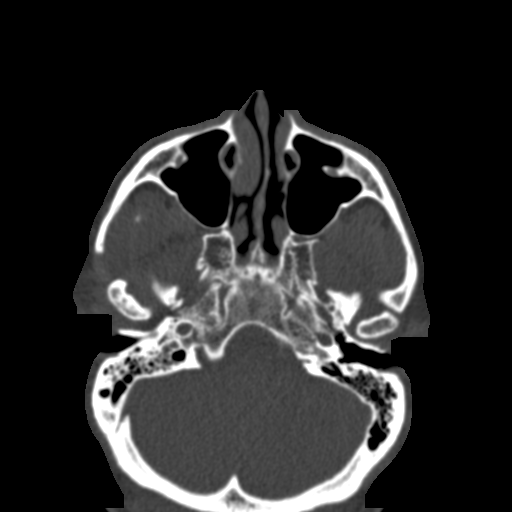
[im 67/182  bone]
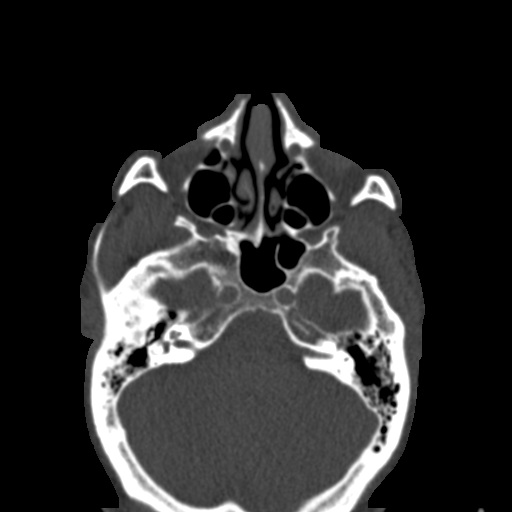
[im 77/182  bone]
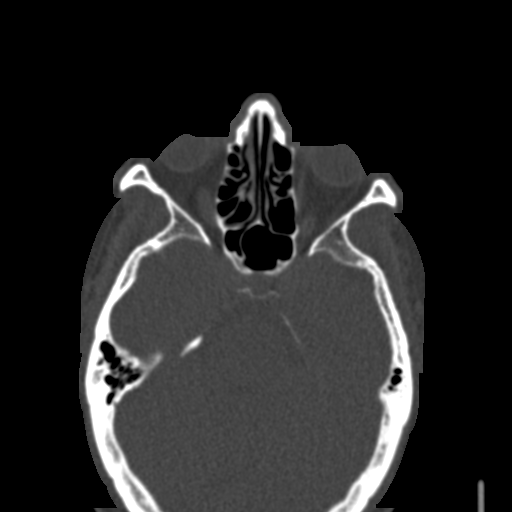
[im 86/182  bone]
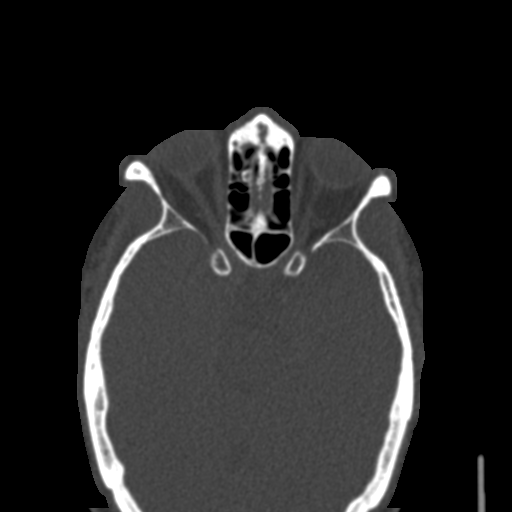
[im 96/182  brain]
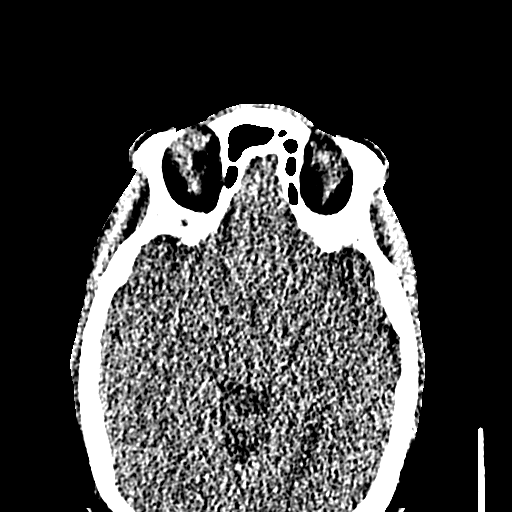
[im 96/182  bone]
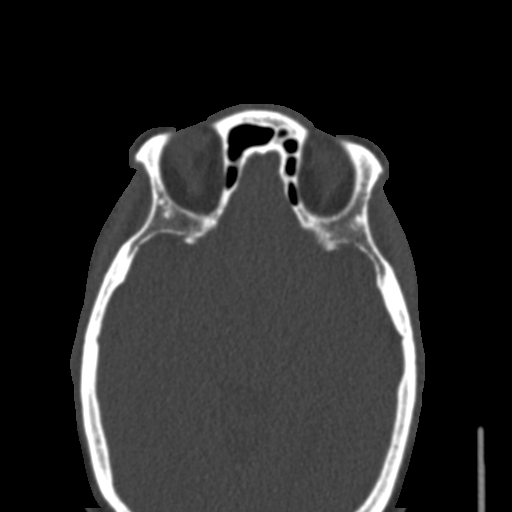
[im 105/182  bone]
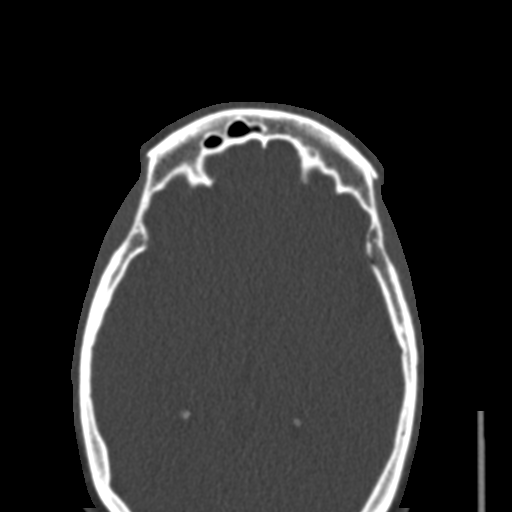
[im 115/182  bone]
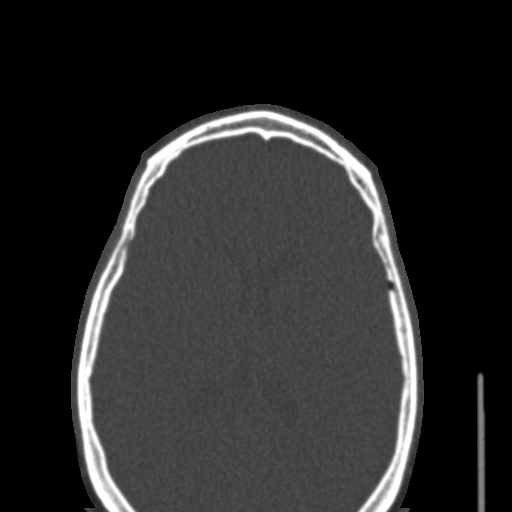
[im 124/182  bone]
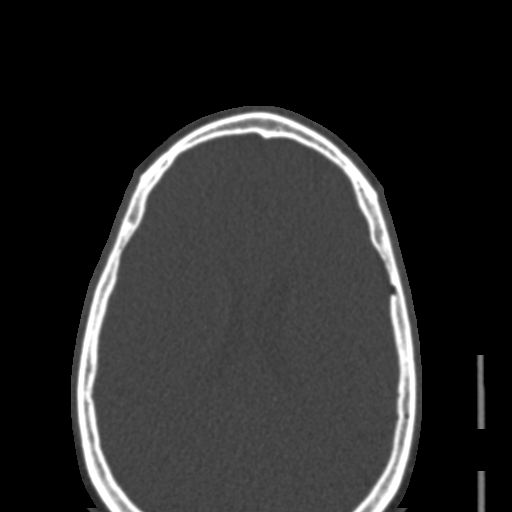
[im 143/182  brain]
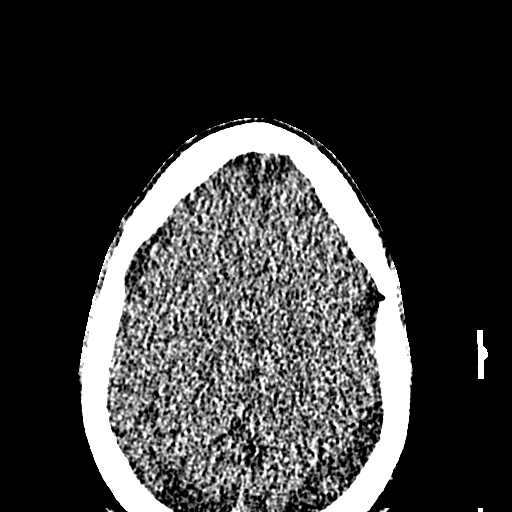
[im 143/182  bone]
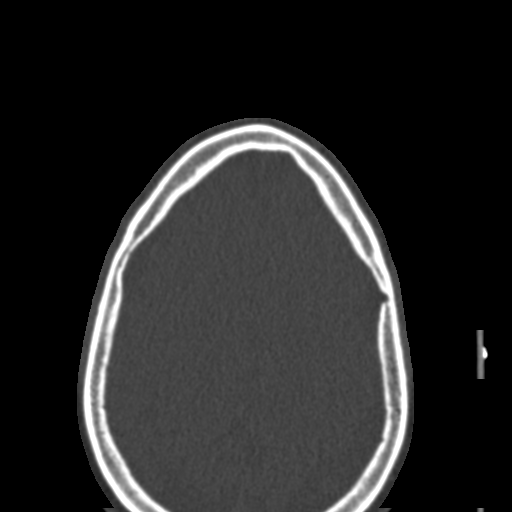
[im 153/182  bone]
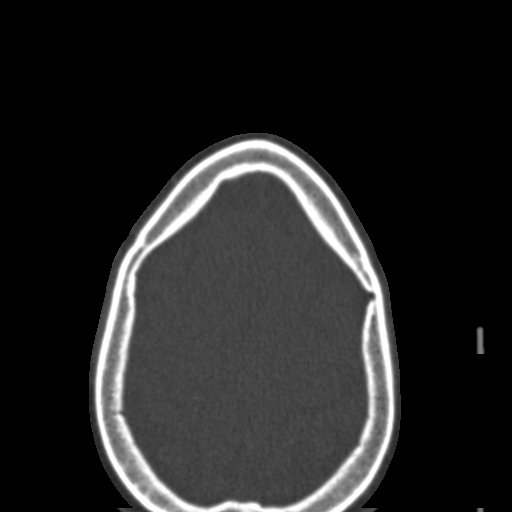
[im 162/182  bone]
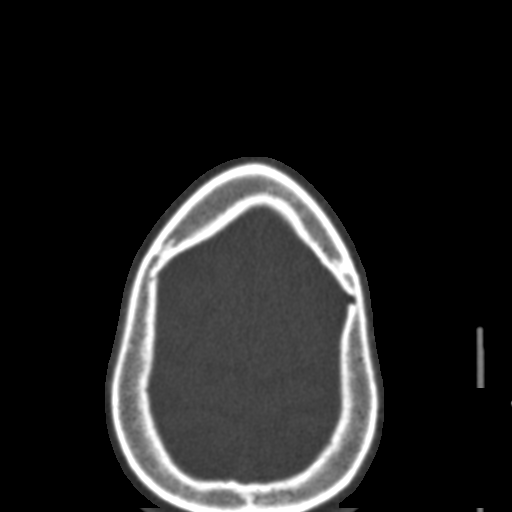
[im 172/182  bone]
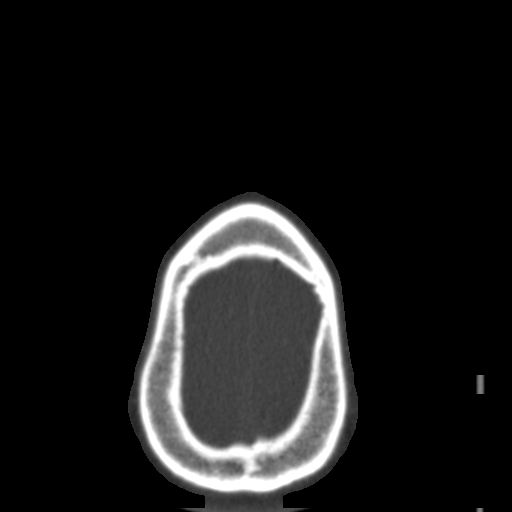

[16 of 30 positions shown; findings below may reference images not displayed]

FINDINGS: Paranasal sinuses:

Frontal: Normally aerated. Patent frontal sinus drainage pathways.

Ethmoid: Normally aerated.

Maxillary: Trace mucosal thickening along the floor of the right
maxillary sinus. Otherwise.

Sphenoid: Normally aerated. Patent sphenoethmoidal recesses.

Right ostiomeatal unit: Patent.

Left ostiomeatal unit: Patent.

Nasal passages: Patent. Leftward septal deviation.

Other: Orbits and intracranial compartment are unremarkable. Visible
mastoid air cells are normally aerated.
IMPRESSION: Trace mucosal thickening along the floor of the right maxillary
sinus. Otherwise clear paranasal sinuses. Patent sinus drainage
pathways.

## 2021-10-25 ENCOUNTER — Other Ambulatory Visit: Payer: Self-pay

## 2021-10-25 ENCOUNTER — Encounter: Payer: Self-pay | Admitting: Nurse Practitioner

## 2021-10-25 ENCOUNTER — Ambulatory Visit: Admitting: Nurse Practitioner

## 2021-10-25 VITALS — BP 118/88 | HR 92 | Temp 98.4°F | Resp 16 | Ht 67.0 in | Wt 176.0 lb

## 2021-10-25 DIAGNOSIS — G43009 Migraine without aura, not intractable, without status migrainosus: Secondary | ICD-10-CM | POA: Diagnosis not present

## 2021-10-25 DIAGNOSIS — K219 Gastro-esophageal reflux disease without esophagitis: Secondary | ICD-10-CM | POA: Diagnosis not present

## 2021-10-25 DIAGNOSIS — E782 Mixed hyperlipidemia: Secondary | ICD-10-CM | POA: Diagnosis not present

## 2021-10-25 NOTE — Progress Notes (Cosign Needed)
[facility], [place] [address]  Internal MEDICINE  Office Visit Note  Patient Name: Roger Mclaughlin  [id number]  [id number]  Date of Service: 10/25/2021  Chief Complaint  Patient presents with   Follow-up   Gastroesophageal Reflux   Hyperlipidemia    HPI Roger Mclaughlin presents for follow-up visit for gastroesophageal reflux, hyperlipidemia and migraines.  His acid reflux is stable with esomeprazole 40 mg daily.  He takes atorvastatin 20 mg daily for hyperlipidemia.  He is seen by neurology for migraines and was prescribed Emgality but is waiting for his insurance to complete the prior authorization.  Patient is requesting samples of Emgality today.    Current Medication: Outpatient Encounter Medications as of 10/25/2021  Medication Sig   atorvastatin (LIPITOR) 20 MG tablet Take 1 tablet (20 mg total) by mouth at bedtime.   clidinium-chlordiazePOXIDE (LIBRAX) 5-2.5 MG capsule Take 1 capsule by mouth 3 (three) times daily before meals.   esomeprazole (NEXIUM) 40 MG capsule Take 1 capsule (40 mg total) by mouth daily at 12 noon.   Galcanezumab-gnlm 120 MG/ML SOAJ Inject into the skin.   levothyroxine (SYNTHROID) 50 MCG tablet TAKE 1 TABLET EVERY MORNING BEFORE BREAKFAST   linaclotide (LINZESS) 72 MCG capsule    Magnesium 500 MG TABS Take 2 tablets by mouth daily.   nortriptyline (PAMELOR) 10 MG capsule    polyethylene glycol powder (GLYCOLAX/MIRALAX) 17 GM/SCOOP powder Take 1 Container by mouth every evening.   [DISCONTINUED] amoxicillin-clavulanate (AUGMENTIN) 875-125 MG tablet Take 1 tablet by mouth 2 (two) times daily.   No facility-administered encounter medications on file as of 10/25/2021.    Surgical History: Past Surgical History:  Procedure Laterality Date   COLONOSCOPY     COLONOSCOPY WITH PROPOFOL N/A 01/23/2017   Procedure: COLONOSCOPY WITH PROPOFOL;  Surgeon: Roger Bellows, MD;  Location: [facility] ENDOSCOPY;  Service: Endoscopy;  Laterality: N/A;    ESOPHAGOGASTRODUODENOSCOPY     WISDOM TOOTH EXTRACTION      Medical History: Past Medical History:  Diagnosis Date   Chronic sinusitis    Constipation    GERD (gastroesophageal reflux disease)    Hypercholesteremia    Hypothyroidism    Irritable bowel syndrome     Family History: Family History  Problem Relation Age of Onset   Irritable bowel syndrome Mother    Hypertension Mother    Diabetes Mother    Prostate cancer Father    Hypertension Father    Diabetes Father    Irritable bowel syndrome Sister     Social History   Socioeconomic History   Marital status: Single    Spouse name: Not on file   Number of children: Not on file   Years of education: Not on file   Highest education level: Not on file  Occupational History   Not on file  Tobacco Use   Smoking status: Never   Smokeless tobacco: Never  Vaping Use   Vaping Use: Never used  Substance and Sexual Activity   Alcohol use: Yes    Alcohol/week: 7.0 standard drinks    Types: 7 Glasses of wine per week    Comment: 1 glass of wine a day   Drug use: No   Sexual activity: Yes    Birth control/protection: Condom  Other Topics Concern   Not on file  Social History Narrative   Not on file   Social Determinants of Health   Financial Resource Strain: Not on file  Food Insecurity: Not on file  Transportation  Needs: Not on file  Physical Activity: Not on file  Stress: Not on file  Social Connections: Not on file  Intimate Partner Violence: Not on file      Review of Systems  Constitutional:  Negative for chills, fatigue and unexpected weight change.  HENT:  Negative for congestion, rhinorrhea, sneezing and sore throat.   Eyes:  Negative for redness.  Respiratory:  Negative for cough, chest tightness and shortness of breath.   Cardiovascular:  Negative for chest pain and palpitations.  Gastrointestinal:  Negative for abdominal pain, constipation, diarrhea, nausea and vomiting.  Genitourinary:   Negative for dysuria and frequency.  Musculoskeletal:  Negative for arthralgias, back pain, joint swelling and neck pain.  Skin:  Negative for rash.  Neurological:  Positive for headaches. Negative for tremors and numbness.  Hematological:  Negative for adenopathy. Does not bruise/bleed easily.  Psychiatric/Behavioral:  Negative for behavioral problems (Depression), sleep disturbance and suicidal ideas. The patient is not nervous/anxious.    Vital Signs: BP 118/88    Pulse 92    Temp 98.4 F (36.9 C)    Resp 16    Ht 5\' 7"  (1.702 m)    Wt 176 lb (79.8 kg)    SpO2 98%    BMI 27.57 kg/m    Physical Exam Vitals reviewed.  Constitutional:      General: He is not in acute distress.    Appearance: Normal appearance. He is normal weight. He is not ill-appearing.  HENT:     Head: Normocephalic and atraumatic.  Eyes:     Pupils: Pupils are equal, round, and reactive to light.  Cardiovascular:     Rate and Rhythm: Normal rate and regular rhythm.  Pulmonary:     Effort: Pulmonary effort is normal. No respiratory distress.  Neurological:     Mental Status: He is alert and oriented to person, place, and time.  Psychiatric:        Mood and Affect: Mood normal.        Behavior: Behavior normal.       Assessment/Plan: 1. Migraine without aura and without status migrainosus, not intractable Sample of emgality provided to patient, he is waiting for prior approval from his neurologist.   2. Mixed hyperlipidemia Takes atorvastatin 20 mg daily.   3. Gastroesophageal reflux disease without esophagitis Well controlled with esomeprazole.    General Counseling: Roger Mclaughlin understanding of the findings of todays visit and agrees with plan of treatment. I have discussed any further diagnostic evaluation that may be needed or ordered today. We also reviewed his medications today. he has been encouraged to call the office with any questions or concerns that should arise related to todays  visit.    No orders of the defined types were placed in this encounter.   No orders of the defined types were placed in this encounter.   Return in about 6 months (around 04/24/2022) for CPE, [place] PCP.   Total time spent:30 Minutes Time spent includes review of chart, medications, test results, and follow up plan with the patient.   [place] Controlled Substance Database was reviewed by me.  This patient was seen by Jonetta Osgood, FNP-C in collaboration with Dr. Clayborn Bigness as a part of collaborative care agreement.   Armilda Vanderlinden R. Valetta Fuller, MSN, FNP-C Internal medicine

## 2021-11-24 ENCOUNTER — Encounter: Payer: Self-pay | Admitting: Nurse Practitioner

## 2021-12-24 ENCOUNTER — Telehealth: Payer: Self-pay | Admitting: Gastroenterology

## 2021-12-24 NOTE — Telephone Encounter (Signed)
Patient left vm requesting to schedule colonoscopy.

## 2021-12-25 ENCOUNTER — Telehealth: Payer: Self-pay

## 2021-12-25 NOTE — Telephone Encounter (Signed)
Returned phone call to schedule colonoscopy.  LVM for pt to call back to schedule colonoscopy.  Last colonoscopy was with Dr. Vicente Males 01/23/2017. History of colon polyps noted on 04/28/14 office visit with Dr. Jacqulyn Liner. ?

## 2022-01-03 ENCOUNTER — Telehealth: Payer: Self-pay

## 2022-01-03 NOTE — Telephone Encounter (Signed)
Patient has been informed that since he has transferred GI Care to [facility] after having his colonoscopy with Dr. Tobi Bastos in 2018-he will need to contact [facility] to schedule his colonoscopy.   ?He has been provided the phone number to [facility] GI Dept to schedule. ? ?Thanks, ?Marcelino Duster, CMA ?

## 2022-01-03 NOTE — Telephone Encounter (Signed)
Dr. Tobi Bastos Mr. Stanek saw you in last in 2018 for his colonoscopy, however since that colonoscopy he has been seen by [facility].  Nov 2020 (Adenomatous Polyp Vevelyn Pat) 03/05/21 (IBS Christiane London)08/14/21  Breath test ordered by  Vevelyn Pat. ? ?Would you like for me to schedule with you, or advise him to go back to [facility].  This is a recall colonoscopy performed by you 01/23/17. ? ?Please advise.  Thanks, ? ?Marcelino Duster, CMA ? ? ?

## 2022-02-01 ENCOUNTER — Encounter: Payer: Self-pay | Admitting: Physician Assistant

## 2022-02-01 ENCOUNTER — Telehealth (INDEPENDENT_AMBULATORY_CARE_PROVIDER_SITE_OTHER): Admitting: Physician Assistant

## 2022-02-01 VITALS — Resp 16 | Ht 67.0 in | Wt 170.0 lb

## 2022-02-01 DIAGNOSIS — J0101 Acute recurrent maxillary sinusitis: Secondary | ICD-10-CM

## 2022-02-01 MED ORDER — AMOXICILLIN-POT CLAVULANATE 875-125 MG PO TABS: 1.0000 | ORAL_TABLET | Freq: Two times a day (BID) | ORAL | 0 refills | Status: DC

## 2022-02-01 NOTE — Progress Notes (Signed)
[facility] [address] [place], [place] [address]  Internal MEDICINE  Telephone Visit  Patient Name: Roger Mclaughlin  [id number]  [id number]  Date of Service: 02/12/2022  I connected with the patient at 10:46 by telephone and verified the patients identity using two identifiers.   I discussed the limitations, risks, security and privacy concerns of performing an evaluation and management service by telephone and the availability of in person appointments. I also discussed with the patient that there may be a patient responsible charge related to the service.  The patient expressed understanding and agrees to proceed.    Chief Complaint  Patient presents with   Telephone Screen   Telephone Assessment   Nasal Congestion    Mostly congested on left side of face (eye, jaw, nostril) + causing headache     HPI Pt is here for virtual sick visit -He has been experiencing sinus congestion for the past few weeks. Worse on left with pressure and headache.  -he has tried to traet with OTC options, but continues to have worsening of his symptoms -He reports he gets sinus congestion and sinusitis frequently and is interested in seeing ENT  Current Medication: Outpatient Encounter Medications as of 02/01/2022  Medication Sig   amoxicillin-clavulanate (AUGMENTIN) 875-125 MG tablet Take 1 tablet by mouth 2 (two) times daily. Take with food.   atorvastatin (LIPITOR) 20 MG tablet Take 1 tablet (20 mg total) by mouth at bedtime.   clidinium-chlordiazePOXIDE (LIBRAX) 5-2.5 MG capsule Take 1 capsule by mouth 3 (three) times daily before meals.   esomeprazole (NEXIUM) 40 MG capsule Take 1 capsule (40 mg total) by mouth daily at 12 noon.   Galcanezumab-gnlm 120 MG/ML SOAJ Inject into the skin.   levothyroxine (SYNTHROID) 50 MCG tablet TAKE 1 TABLET EVERY MORNING BEFORE BREAKFAST   linaclotide (LINZESS) 72 MCG capsule    Magnesium 500 MG TABS Take 2 tablets by mouth daily.   nortriptyline  (PAMELOR) 10 MG capsule    polyethylene glycol powder (GLYCOLAX/MIRALAX) 17 GM/SCOOP powder Take 1 Container by mouth every evening.   No facility-administered encounter medications on file as of 02/01/2022.    Surgical History: Past Surgical History:  Procedure Laterality Date   COLONOSCOPY     COLONOSCOPY WITH PROPOFOL N/A 01/23/2017   Procedure: COLONOSCOPY WITH PROPOFOL;  Surgeon: Wyline Mood, MD;  Location: [facility] END[facility];  Service: Endoscopy;  Laterality: N/A;   ESOPHAGOGASTRODUODENOSCOPY     WISDOM TOOTH EXTRACTION      Medical History: Past Medical History:  Diagnosis Date   Chronic sinusitis    Constipation    GERD (gastroesophageal reflux disease)    Hypercholesteremia    Hypothyroidism    Irritable bowel syndrome     Family History: Family History  Problem Relation Age of Onset   Irritable bowel syndrome Mother    Hypertension Mother    Diabetes Mother    Prostate cancer Father    Hypertension Father    Diabetes Father    Irritable bowel syndrome Sister     Social History   Socioeconomic History   Marital status: Single    Spouse name: Not on file   Number of children: Not on file   Years of education: Not on file   Highest education level: Not on file  Occupational History   Not on file  Tobacco Use   Smoking status: Never   Smokeless tobacco: Never  Vaping Use   Vaping Use: Never used  Substance and Sexual Activity  Alcohol use: Yes    Alcohol/week: 7.0 standard drinks    Types: 7 Glasses of wine per week    Comment: 1 glass of wine a day   Drug use: No   Sexual activity: Yes    Birth control/protection: Condom  Other Topics Concern   Not on file  Social History Narrative   Not on file   Social Determinants of Health   Financial Resource Strain: Not on file  Food Insecurity: Not on file  Transportation Needs: Not on file  Physical Activity: Not on file  Stress: Not on file  Social Connections: Not on file  Intimate Partner  Violence: Not on file      Review of Systems  Constitutional:  Negative for fatigue and fever.  HENT:  Positive for congestion, postnasal drip and sinus pressure. Negative for mouth sores.   Respiratory:  Positive for cough. Negative for shortness of breath and wheezing.   Cardiovascular:  Negative for chest pain.  Genitourinary:  Negative for flank pain.  Neurological:  Positive for headaches.  Psychiatric/Behavioral: Negative.     Vital Signs: Resp 16   Ht 5\' 7"  (1.702 m)   Wt 170 lb (77.1 kg)   BMI 26.63 kg/m    Observation/Objective:  Pt is able to carry out conversation   Assessment/Plan: 1. Acute recurrent maxillary sinusitis Will start on augmentin and may utilize mucinex and nasal spray. Will also send ENT referral as patient gets recurrent sinus infections - amoxicillin-clavulanate (AUGMENTIN) 875-125 MG tablet; Take 1 tablet by mouth 2 (two) times daily. Take with food.  Dispense: 20 tablet; Refill: 0 - Ambulatory referral to ENT   General Counseling: josecarlos harriott understanding of the findings of today's phone visit and agrees with plan of treatment. I have discussed any further diagnostic evaluation that may be needed or ordered today. We also reviewed his medications today. he has been encouraged to call the office with any questions or concerns that should arise related to todays visit.    Orders Placed This Encounter  Procedures   Ambulatory referral to ENT    Meds ordered this encounter  Medications   amoxicillin-clavulanate (AUGMENTIN) 875-125 MG tablet    Sig: Take 1 tablet by mouth 2 (two) times daily. Take with food.    Dispense:  20 tablet    Refill:  0    Time spent:25 Minutes    Dr Donzetta Starch Internal medicine

## 2022-02-25 ENCOUNTER — Other Ambulatory Visit: Payer: Self-pay | Admitting: Nurse Practitioner

## 2022-02-25 DIAGNOSIS — E782 Mixed hyperlipidemia: Secondary | ICD-10-CM

## 2022-02-26 ENCOUNTER — Telehealth: Payer: Self-pay

## 2022-02-26 NOTE — Telephone Encounter (Signed)
Completed medical records for [facility] [employer] to [employer] records retrieval center  [id number]  [place] [address]

## 2022-03-01 ENCOUNTER — Telehealth: Payer: Self-pay

## 2022-03-01 NOTE — Telephone Encounter (Signed)
ENT referral sent via [facility] to  [facility] 

## 2022-03-05 NOTE — Telephone Encounter (Signed)
04/18/22 @ 9:30 ENT appointment-Roger Mclaughlin

## 2022-04-04 ENCOUNTER — Ambulatory Visit (INDEPENDENT_AMBULATORY_CARE_PROVIDER_SITE_OTHER): Admitting: Physician Assistant

## 2022-04-04 ENCOUNTER — Encounter: Payer: Self-pay | Admitting: Physician Assistant

## 2022-04-04 VITALS — BP 106/73 | HR 80 | Temp 98.9°F | Resp 16 | Ht 67.0 in | Wt 172.0 lb

## 2022-04-04 DIAGNOSIS — J0101 Acute recurrent maxillary sinusitis: Secondary | ICD-10-CM

## 2022-04-04 DIAGNOSIS — G4733 Obstructive sleep apnea (adult) (pediatric): Secondary | ICD-10-CM | POA: Diagnosis not present

## 2022-04-04 MED ORDER — AMOXICILLIN-POT CLAVULANATE 875-125 MG PO TABS: 1.0000 | ORAL_TABLET | Freq: Two times a day (BID) | ORAL | 0 refills | Status: DC

## 2022-04-04 NOTE — Progress Notes (Signed)
[facility] [address] [place], [place] [address]  Internal MEDICINE  Office Visit Note  Patient Name: Roger Mclaughlin  [id number]  [id number]  Date of Service: 04/04/2022  Chief Complaint  Patient presents with   Recurrent Sinusitis    Congestion, headache, pain behind left eye/facial pain/tooth pain     HPI Pt is here for a sick visit. -He presents with sinus congestion that worsened on Sunday, but started last week. He has frequent sinus infections and was referred to ENT after last infection in May. States he has an appt with ENT coming up at the beginning of August -Has tried sinus sprays before and mucinex but these never seem to help so he doesn't bother taking them anymore. He also doesn't take an antihistamine either. States he had a problem with flonase and suggested trying azelastine instead -His most recent infections, including current symptoms have presented a little different than in the past in that they seem to be left-sided with congestion and pressure on the left side behind his eye and down maxillary sinus with pain into jaw and teeth. He states the tooth pain is not present today though. Denies cough, SOBm, or wheezing -he does see neurology for headaches -he admits he does have mild OSA that was dx in past but never started treatment and doesn't believe he would tolerate cpap and since mild didn't think he really needed to treat it. Discussed his headaches could be related to untreated OSA and sinus congestion may make this worse. Discussed alternatives such as oral appliance. He may discuss his OSA finding further with ENT  Current Medication:  Outpatient Encounter Medications as of 04/04/2022  Medication Sig   amoxicillin-clavulanate (AUGMENTIN) 875-125 MG tablet Take 1 tablet by mouth 2 (two) times daily. Take with food.   atorvastatin (LIPITOR) 20 MG tablet TAKE 1 TABLET AT BEDTIME   clidinium-chlordiazePOXIDE (LIBRAX) 5-2.5 MG capsule Take 1  capsule by mouth 3 (three) times daily before meals.   esomeprazole (NEXIUM) 40 MG capsule Take 1 capsule (40 mg total) by mouth daily at 12 noon.   Galcanezumab-gnlm 120 MG/ML SOAJ Inject into the skin.   levothyroxine (SYNTHROID) 50 MCG tablet TAKE 1 TABLET EVERY MORNING BEFORE BREAKFAST   linaclotide (LINZESS) 72 MCG capsule    Magnesium 500 MG TABS Take 2 tablets by mouth daily.   nortriptyline (PAMELOR) 10 MG capsule    polyethylene glycol powder (GLYCOLAX/MIRALAX) 17 GM/SCOOP powder Take 1 Container by mouth every evening.   [DISCONTINUED] amoxicillin-clavulanate (AUGMENTIN) 875-125 MG tablet Take 1 tablet by mouth 2 (two) times daily. Take with food.   No facility-administered encounter medications on file as of 04/04/2022.      Medical History: Past Medical History:  Diagnosis Date   Chronic sinusitis    Constipation    GERD (gastroesophageal reflux disease)    Hypercholesteremia    Hypothyroidism    Irritable bowel syndrome      Vital Signs: BP 106/73   Pulse 80   Temp 98.9 F (37.2 C)   Resp 16   Ht 5\' 7"  (1.702 m)   Wt 172 lb (78 kg)   SpO2 97%   BMI 26.94 kg/m    Review of Systems  Constitutional:  Negative for fatigue and fever.  HENT:  Positive for congestion, postnasal drip and sinus pressure. Negative for mouth sores.   Respiratory:  Positive for cough. Negative for shortness of breath and wheezing.   Cardiovascular:  Negative for chest pain.  Genitourinary:  Negative for flank pain.  Neurological:  Positive for headaches.  Psychiatric/Behavioral: Negative.      Physical Exam Vitals reviewed.  Constitutional:      General: He is not in acute distress.    Appearance: Normal appearance. He is normal weight. He is not ill-appearing.  HENT:     Head: Normocephalic and atraumatic.     Nose: Congestion present.     Mouth/Throat:     Pharynx: Posterior oropharyngeal erythema present.  Eyes:     Pupils: Pupils are equal, round, and reactive to  light.  Cardiovascular:     Rate and Rhythm: Normal rate and regular rhythm.  Pulmonary:     Effort: Pulmonary effort is normal. No respiratory distress.  Musculoskeletal:     Cervical back: Normal range of motion.  Neurological:     Mental Status: He is alert and oriented to person, place, and time.  Psychiatric:        Mood and Affect: Mood normal.        Behavior: Behavior normal.       Assessment/Plan: 1. Acute recurrent maxillary sinusitis Discussed starting mucinex, antihistamine and azelastine spray and encouraged to use these at first start of symptoms before infection sets in. Will start augmentin BID with food and pt will have visit with ENT as scheduled due to recurrent infections. - amoxicillin-clavulanate (AUGMENTIN) 875-125 MG tablet; Take 1 tablet by mouth 2 (two) times daily. Take with food.  Dispense: 20 tablet; Refill: 0  2. OSA (obstructive sleep apnea) Never started on treatment and may discuss with ENT further or consider visit with dentist for possible oral appliance   General Counseling: Roger Mclaughlin understanding of the findings of todays visit and agrees with plan of treatment. I have discussed any further diagnostic evaluation that may be needed or ordered today. We also reviewed his medications today. he has been encouraged to call the office with any questions or concerns that should arise related to todays visit.    Counseling:    No orders of the defined types were placed in this encounter.   Meds ordered this encounter  Medications   amoxicillin-clavulanate (AUGMENTIN) 875-125 MG tablet    Sig: Take 1 tablet by mouth 2 (two) times daily. Take with food.    Dispense:  20 tablet    Refill:  0    Time spent:30 Minutes

## 2022-04-12 ENCOUNTER — Encounter: Admitting: Nurse Practitioner

## 2022-04-23 ENCOUNTER — Encounter: Admitting: Nurse Practitioner

## 2022-05-24 ENCOUNTER — Telehealth: Payer: Self-pay | Admitting: Internal Medicine

## 2022-05-24 NOTE — Telephone Encounter (Signed)
MR mailed to: [facility] [address] [place], [place] [address] - [place]

## 2022-05-27 ENCOUNTER — Encounter: Admitting: Nurse Practitioner

## 2022-06-14 ENCOUNTER — Ambulatory Visit (INDEPENDENT_AMBULATORY_CARE_PROVIDER_SITE_OTHER): Admitting: Nurse Practitioner

## 2022-06-14 ENCOUNTER — Encounter: Payer: Self-pay | Admitting: Nurse Practitioner

## 2022-06-14 VITALS — BP 124/78 | HR 93 | Temp 97.3°F | Resp 16 | Ht 67.0 in | Wt 168.8 lb

## 2022-06-14 DIAGNOSIS — E782 Mixed hyperlipidemia: Secondary | ICD-10-CM | POA: Diagnosis not present

## 2022-06-14 DIAGNOSIS — Z0001 Encounter for general adult medical examination with abnormal findings: Secondary | ICD-10-CM | POA: Diagnosis not present

## 2022-06-14 DIAGNOSIS — R300 Dysuria: Secondary | ICD-10-CM

## 2022-06-14 DIAGNOSIS — E039 Hypothyroidism, unspecified: Secondary | ICD-10-CM

## 2022-06-14 DIAGNOSIS — E559 Vitamin D deficiency, unspecified: Secondary | ICD-10-CM | POA: Diagnosis not present

## 2022-06-14 DIAGNOSIS — Z760 Encounter for issue of repeat prescription: Secondary | ICD-10-CM

## 2022-06-14 DIAGNOSIS — N1831 Chronic kidney disease, stage 3a: Secondary | ICD-10-CM | POA: Diagnosis not present

## 2022-06-14 DIAGNOSIS — Z23 Encounter for immunization: Secondary | ICD-10-CM

## 2022-06-14 MED ORDER — ZOSTER VAC RECOMB ADJUVANTED 50 MCG/0.5ML IM SUSR: 0.5000 mL | Freq: Once | INTRAMUSCULAR | 0 refills | Status: AC

## 2022-06-14 MED ORDER — CILIDINIUM-CHLORDIAZEPOXIDE 2.5-5 MG PO CAPS: 1.0000 | ORAL_CAPSULE | Freq: Three times a day (TID) | ORAL | 5 refills | Status: DC

## 2022-06-14 MED ORDER — TETANUS-DIPHTH-ACELL PERTUSSIS 5-2.5-18.5 LF-MCG/0.5 IM SUSP: 0.5000 mL | Freq: Once | INTRAMUSCULAR | 0 refills | Status: AC

## 2022-06-14 MED ORDER — ESOMEPRAZOLE MAGNESIUM 40 MG PO CPDR: 40.0000 mg | DELAYED_RELEASE_CAPSULE | Freq: Every day | ORAL | 3 refills | Status: DC

## 2022-06-14 MED ORDER — LEVOTHYROXINE SODIUM 50 MCG PO TABS: ORAL_TABLET | ORAL | 1 refills | Status: DC

## 2022-06-14 NOTE — Progress Notes (Signed)
[facility] [place], [place] [address]  Internal MEDICINE  Office Visit Note  Patient Name: Roger Mclaughlin  [id number]  [id number]  Date of Service: 06/14/2022  Chief Complaint  Patient presents with  . Annual Exam  . Hyperlipidemia  . Gastroesophageal Reflux    HPI [name] presents for an annual well visit and physical exam.  Well-appearing 61 year old male with GERD and hypothyroidism.  --no significant changes in diet or lifestyle in the past year. Still working full time.  --denies any new or worsening pain. --due for routine labs, last 4 PSA levels were normal, so he is UTD on PSA for now. --scheduled for routine colonoscopy in October.  --nonsmoker, drinks 1 8-oz glass of red wine daily and denies illicit drug use    Current Medication: Outpatient Encounter Medications as of 06/14/2022  Medication Sig  . atorvastatin (LIPITOR) 20 MG tablet TAKE 1 TABLET AT BEDTIME  . Galcanezumab-gnlm 120 MG/ML SOAJ Inject into the skin.  Marland Kitchen linaclotide (LINZESS) 72 MCG capsule   . Magnesium 500 MG TABS Take 2 tablets by mouth daily.  . polyethylene glycol powder (GLYCOLAX/MIRALAX) 17 GM/SCOOP powder Take 1 Container by mouth every evening.  . [DISCONTINUED] amoxicillin-clavulanate (AUGMENTIN) 875-125 MG tablet Take 1 tablet by mouth 2 (two) times daily. Take with food.  . [DISCONTINUED] clidinium-chlordiazePOXIDE (LIBRAX) 5-2.5 MG capsule Take 1 capsule by mouth 3 (three) times daily before meals.  . [DISCONTINUED] esomeprazole (NEXIUM) 40 MG capsule Take 1 capsule (40 mg total) by mouth daily at 12 noon.  . [DISCONTINUED] levothyroxine (SYNTHROID) 50 MCG tablet TAKE 1 TABLET EVERY MORNING BEFORE BREAKFAST  . [DISCONTINUED] nortriptyline (PAMELOR) 10 MG capsule   . [DISCONTINUED] Tdap (BOOSTRIX) 5-2.5-18.5 LF-MCG/0.5 injection Inject 0.5 mLs into the muscle once.  . [DISCONTINUED] Zoster Vaccine Adjuvanted [facility]) injection Inject 0.5 mLs into the muscle  once.  . clidinium-chlordiazePOXIDE (LIBRAX) 5-2.5 MG capsule Take 1 capsule by mouth 3 (three) times daily before meals.  Marland Kitchen esomeprazole (NEXIUM) 40 MG capsule Take 1 capsule (40 mg total) by mouth daily at 12 noon.  Marland Kitchen levothyroxine (SYNTHROID) 50 MCG tablet TAKE 1 TABLET EVERY MORNING BEFORE BREAKFAST  . Tdap (BOOSTRIX) 5-2.5-18.5 LF-MCG/0.5 injection Inject 0.5 mLs into the muscle once for 1 dose.  Marland Kitchen Zoster Vaccine Adjuvanted [facility]) injection Inject 0.5 mLs into the muscle once for 1 dose.   No facility-administered encounter medications on file as of 06/14/2022.    Surgical History: Past Surgical History:  Procedure Laterality Date  . COLONOSCOPY    . COLONOSCOPY WITH PROPOFOL N/A 01/23/2017   Procedure: COLONOSCOPY WITH PROPOFOL;  Surgeon: Jonathon Bellows, MD;  Location: [facility] ENDOSCOPY;  Service: Endoscopy;  Laterality: N/A;  . ESOPHAGOGASTRODUODENOSCOPY    . WISDOM TOOTH EXTRACTION      Medical History: Past Medical History:  Diagnosis Date  . Chronic sinusitis   . Constipation   . GERD (gastroesophageal reflux disease)   . Hypercholesteremia   . Hypothyroidism   . Irritable bowel syndrome     Family History: Family History  Problem Relation Age of Onset  . Irritable bowel syndrome Mother   . Hypertension Mother   . Diabetes Mother   . Prostate cancer Father   . Hypertension Father   . Diabetes Father   . Irritable bowel syndrome Sister     Social History   Socioeconomic History  . Marital status: Single    Spouse name: Not on file  . Number of children: Not on file  .  Years of education: Not on file  . Highest education level: Not on file  Occupational History  . Not on file  Tobacco Use  . Smoking status: Never  . Smokeless tobacco: Never  Vaping Use  . Vaping Use: Never used  Substance and Sexual Activity  . Alcohol use: Yes    Alcohol/week: 7.0 standard drinks of alcohol    Types: 7 Glasses of wine per week    Comment: 1 glass of wine a day  .  Drug use: No  . Sexual activity: Yes    Birth control/protection: Condom  Other Topics Concern  . Not on file  Social History Narrative  . Not on file   Social Determinants of Health   Financial Resource Strain: Not on file  Food Insecurity: Not on file  Transportation Needs: Not on file  Physical Activity: Not on file  Stress: Not on file  Social Connections: Not on file  Intimate Partner Violence: Not on file      Review of Systems  Constitutional:  Negative for activity change, appetite change, chills, fatigue, fever and unexpected weight change.  HENT: Negative.  Negative for congestion, ear pain, rhinorrhea, sore throat and trouble swallowing.   Eyes: Negative.   Respiratory: Negative.  Negative for cough, chest tightness, shortness of breath and wheezing.   Cardiovascular: Negative.  Negative for chest pain.  Gastrointestinal: Negative.  Negative for abdominal pain, blood in stool, constipation, diarrhea, nausea and vomiting.  Endocrine: Negative.   Genitourinary: Negative.  Negative for difficulty urinating, dysuria, frequency, hematuria and urgency.  Musculoskeletal: Negative.  Negative for arthralgias, back pain, joint swelling, myalgias and neck pain.  Skin: Negative.  Negative for rash and wound.  Allergic/Immunologic: Negative.  Negative for immunocompromised state.  Neurological: Negative.  Negative for dizziness, seizures, numbness and headaches.  Hematological: Negative.   Psychiatric/Behavioral: Negative.  Negative for behavioral problems, self-injury and suicidal ideas. The patient is not nervous/anxious.     Vital Signs: BP 124/78   Pulse 93   Temp (!) 97.3 F (36.3 C)   Resp 16   Ht $R'5\' 7"'EL$  (1.702 m)   Wt 168 lb 12.8 oz (76.6 kg)   SpO2 98%   BMI 26.44 kg/m    Physical Exam Vitals reviewed.  Constitutional:      General: He is awake. He is not in acute distress.    Appearance: Normal appearance. He is well-developed, well-groomed and overweight.  He is not ill-appearing.  HENT:     Head: Normocephalic and atraumatic.     Right Ear: Tympanic membrane, ear canal and external ear normal.     Left Ear: Tympanic membrane, ear canal and external ear normal.     Nose: Nose normal. No congestion or rhinorrhea.     Mouth/Throat:     Mouth: Mucous membranes are moist.     Pharynx: Oropharynx is clear. No oropharyngeal exudate or posterior oropharyngeal erythema.  Eyes:     General: Lids are normal. Vision grossly intact. Gaze aligned appropriately.     Extraocular Movements: Extraocular movements intact.     Conjunctiva/sclera: Conjunctivae normal.     Pupils: Pupils are equal, round, and reactive to light.     Funduscopic exam:    Right eye: Red reflex present.        Left eye: Red reflex present. Neck:     Vascular: No carotid bruit.  Cardiovascular:     Rate and Rhythm: Normal rate and regular rhythm.     Pulses: Normal  pulses.     Heart sounds: Normal heart sounds. No murmur heard. Pulmonary:     Effort: Pulmonary effort is normal. No respiratory distress.     Breath sounds: Normal breath sounds. No wheezing.  Abdominal:     General: Bowel sounds are normal.     Palpations: Abdomen is soft.  Musculoskeletal:        General: Normal range of motion.     Cervical back: Normal range of motion and neck supple.  Lymphadenopathy:     Cervical: No cervical adenopathy.  Skin:    General: Skin is warm and dry.     Capillary Refill: Capillary refill takes less than 2 seconds.  Neurological:     Mental Status: He is alert and oriented to person, place, and time.  Psychiatric:        Mood and Affect: Mood normal.        Behavior: Behavior normal. Behavior is cooperative.        Thought Content: Thought content normal.        Judgment: Judgment normal.       Assessment/Plan: 1. Encounter for routine adult health examination with abnormal findings Age-appropriate preventive screenings and vaccinations discussed, annual physical  exam completed. Routine labs for health maintenance ordered, see below. PHM updated. Upcoming colonoscopy in October next month. Hold off on checking PSA this year, has been normal for the past 4 years and he has no issues with urination.  - CBC with Differential/Platelet - Lipid Profile - CMP14+EGFR - Vitamin D (25 hydroxy) - TSH + free T4  2. Acquired hypothyroidism Continue levothyroxine as prescribed, routine labs ordered - Lipid Profile - TSH + free T4  3. Stage 3a chronic kidney disease (HCC) Continue to monitor kidney function and other routine labs - CBC with Differential/Platelet - CMP14+EGFR - TSH + free T4  4. Mixed hyperlipidemia Repeat lipid panel and other routine labs - Lipid Profile - TSH + free T4  5. Vitamin D deficiency - Vitamin D (25 hydroxy)  6. Dysuria - UA/M w/rflx Culture, Routine  7. Medication refill - esomeprazole (NEXIUM) 40 MG capsule; Take 1 capsule (40 mg total) by mouth daily at 12 noon.  Dispense: 90 capsule; Refill: 3 - clidinium-chlordiazePOXIDE (LIBRAX) 5-2.5 MG capsule; Take 1 capsule by mouth 3 (three) times daily before meals.  Dispense: 90 capsule; Refill: 5 - levothyroxine (SYNTHROID) 50 MCG tablet; TAKE 1 TABLET EVERY MORNING BEFORE BREAKFAST  Dispense: 90 tablet; Refill: 1  8. Need for vaccination - Tdap ([facility]) 5-2.5-18.5 LF-MCG/0.5 injection; Inject 0.5 mLs into the muscle once for 1 dose.  Dispense: 0.5 mL; Refill: 0 - Zoster Vaccine Adjuvanted [facility]) injection; Inject 0.5 mLs into the muscle once for 1 dose.  Dispense: 0.5 mL; Refill: 0    General Counseling: Lester verbalizes understanding of the findings of todays visit and agrees with plan of treatment. I have discussed any further diagnostic evaluation that may be needed or ordered today. We also reviewed his medications today. he has been encouraged to call the office with any questions or concerns that should arise related to todays visit.    Orders Placed This  Encounter  Procedures  . UA/M w/rflx Culture, Routine  . CBC with Differential/Platelet  . Lipid Profile  . CMP14+EGFR  . Vitamin D (25 hydroxy)  . TSH + free T4    Meds ordered this encounter  Medications  . Tdap (BOOSTRIX) 5-2.5-18.5 LF-MCG/0.5 injection    Sig: Inject 0.5 mLs into the muscle once  for 1 dose.    Dispense:  0.5 mL    Refill:  0  . Zoster Vaccine Adjuvanted [facility]) injection    Sig: Inject 0.5 mLs into the muscle once for 1 dose.    Dispense:  0.5 mL    Refill:  0  . esomeprazole (NEXIUM) 40 MG capsule    Sig: Take 1 capsule (40 mg total) by mouth daily at 12 noon.    Dispense:  90 capsule    Refill:  3  . clidinium-chlordiazePOXIDE (LIBRAX) 5-2.5 MG capsule    Sig: Take 1 capsule by mouth 3 (three) times daily before meals.    Dispense:  90 capsule    Refill:  5  . levothyroxine (SYNTHROID) 50 MCG tablet    Sig: TAKE 1 TABLET EVERY MORNING BEFORE BREAKFAST    Dispense:  90 tablet    Refill:  1    Return in about 6 months (around 12/13/2022) for F/U, med refill, Whitten Andreoni PCP.   Total time spent:30 Minutes Time spent includes review of chart, medications, test results, and follow up plan with the patient.   [place] Controlled Substance Database was reviewed by me.  This patient was seen by Jonetta Osgood, FNP-C in collaboration with Dr. Clayborn Bigness as a part of collaborative care agreement.  Doniven Vanpatten R. Valetta Fuller, MSN, FNP-C Internal medicine

## 2022-06-15 LAB — CBC WITH DIFFERENTIAL/PLATELET
Basophils Absolute: 0 10*3/uL (ref 0.0–0.2)
Basos: 1 %
EOS (ABSOLUTE): 0.1 10*3/uL (ref 0.0–0.4)
Eos: 2 %
Hematocrit: 46.8 % (ref 37.5–51.0)
Hemoglobin: 15.4 g/dL (ref 13.0–17.7)
Immature Grans (Abs): 0 10*3/uL (ref 0.0–0.1)
Immature Granulocytes: 0 %
Lymphocytes Absolute: 1.7 10*3/uL (ref 0.7–3.1)
Lymphs: 46 %
MCH: 28.8 pg (ref 26.6–33.0)
MCHC: 32.9 g/dL (ref 31.5–35.7)
MCV: 88 fL (ref 79–97)
Monocytes Absolute: 0.3 10*3/uL (ref 0.1–0.9)
Monocytes: 7 %
Neutrophils Absolute: 1.6 10*3/uL (ref 1.4–7.0)
Neutrophils: 44 %
Platelets: 183 10*3/uL (ref 150–450)
RBC: 5.35 x10E6/uL (ref 4.14–5.80)
RDW: 12.3 % (ref 11.6–15.4)
WBC: 3.6 10*3/uL (ref 3.4–10.8)

## 2022-06-15 LAB — UA/M W/RFLX CULTURE, ROUTINE
Bilirubin, UA: NEGATIVE
Glucose, UA: NEGATIVE
Ketones, UA: NEGATIVE
Leukocytes,UA: NEGATIVE
Nitrite, UA: NEGATIVE
Protein,UA: NEGATIVE
RBC, UA: NEGATIVE
Specific Gravity, UA: 1.008 (ref 1.005–1.030)
Urobilinogen, Ur: 0.2 mg/dL (ref 0.2–1.0)
pH, UA: 6.5 (ref 5.0–7.5)

## 2022-06-15 LAB — VITAMIN D 25 HYDROXY (VIT D DEFICIENCY, FRACTURES): Vit D, 25-Hydroxy: 73.4 ng/mL (ref 30.0–100.0)

## 2022-06-15 LAB — CMP14+EGFR
ALT: 36 IU/L (ref 0–44)
AST: 29 IU/L (ref 0–40)
Albumin/Globulin Ratio: 2 (ref 1.2–2.2)
Albumin: 4.7 g/dL (ref 3.8–4.9)
Alkaline Phosphatase: 93 IU/L (ref 44–121)
BUN/Creatinine Ratio: 12 (ref 10–24)
BUN: 16 mg/dL (ref 8–27)
Bilirubin Total: 0.5 mg/dL (ref 0.0–1.2)
CO2: 22 mmol/L (ref 20–29)
Calcium: 9.9 mg/dL (ref 8.6–10.2)
Chloride: 98 mmol/L (ref 96–106)
Creatinine, Ser: 1.36 mg/dL — ABNORMAL HIGH (ref 0.76–1.27)
Globulin, Total: 2.4 g/dL (ref 1.5–4.5)
Glucose: 93 mg/dL (ref 70–99)
Potassium: 4.5 mmol/L (ref 3.5–5.2)
Sodium: 137 mmol/L (ref 134–144)
Total Protein: 7.1 g/dL (ref 6.0–8.5)
eGFR: 60 mL/min/{1.73_m2} (ref >59)

## 2022-06-15 LAB — TSH+FREE T4
Free T4: 1.19 ng/dL (ref 0.82–1.77)
TSH: 1.11 u[IU]/mL (ref 0.450–4.500)

## 2022-06-15 LAB — MICROSCOPIC EXAMINATION
Bacteria, UA: NONE SEEN
Casts: NONE SEEN /lpf
Epithelial Cells (non renal): NONE SEEN /hpf (ref 0–10)
RBC, Urine: NONE SEEN /hpf (ref 0–2)
WBC, UA: NONE SEEN /hpf (ref 0–5)

## 2022-06-15 LAB — LIPID PANEL
Chol/HDL Ratio: 2.6 ratio (ref 0.0–5.0)
Cholesterol, Total: 192 mg/dL (ref 100–199)
HDL: 75 mg/dL (ref >39)
LDL Chol Calc (NIH): 104 mg/dL — ABNORMAL HIGH (ref 0–99)
Triglycerides: 68 mg/dL (ref 0–149)
VLDL Cholesterol Cal: 13 mg/dL (ref 5–40)

## 2022-07-03 ENCOUNTER — Other Ambulatory Visit: Payer: Self-pay | Admitting: Nurse Practitioner

## 2022-07-03 DIAGNOSIS — Z760 Encounter for issue of repeat prescription: Secondary | ICD-10-CM

## 2022-07-04 ENCOUNTER — Encounter: Payer: Self-pay | Admitting: *Deleted

## 2022-07-05 ENCOUNTER — Ambulatory Visit: Admitting: General Practice

## 2022-07-05 ENCOUNTER — Ambulatory Visit
Admission: RE | Admit: 2022-07-05 | Discharge: 2022-07-05 | Disposition: A | Discharge status: Home / Self Care | Attending: Gastroenterology | Admitting: Gastroenterology

## 2022-07-05 ENCOUNTER — Other Ambulatory Visit: Payer: Self-pay

## 2022-07-05 ENCOUNTER — Encounter
Admission: RE | Disposition: A | Discharge status: Home / Self Care | Payer: Self-pay | Source: Home / Self Care | Attending: Gastroenterology

## 2022-07-05 ENCOUNTER — Encounter: Payer: Self-pay | Admitting: *Deleted

## 2022-07-05 DIAGNOSIS — E039 Hypothyroidism, unspecified: Secondary | ICD-10-CM | POA: Diagnosis not present

## 2022-07-05 DIAGNOSIS — E785 Hyperlipidemia, unspecified: Secondary | ICD-10-CM | POA: Diagnosis not present

## 2022-07-05 DIAGNOSIS — K219 Gastro-esophageal reflux disease without esophagitis: Secondary | ICD-10-CM | POA: Diagnosis not present

## 2022-07-05 DIAGNOSIS — K581 Irritable bowel syndrome with constipation: Secondary | ICD-10-CM | POA: Diagnosis not present

## 2022-07-05 DIAGNOSIS — K640 First degree hemorrhoids: Secondary | ICD-10-CM | POA: Insufficient documentation

## 2022-07-05 DIAGNOSIS — K5730 Diverticulosis of large intestine without perforation or abscess without bleeding: Secondary | ICD-10-CM | POA: Diagnosis not present

## 2022-07-05 DIAGNOSIS — Z86010 Personal history of colonic polyps: Secondary | ICD-10-CM | POA: Insufficient documentation

## 2022-07-05 DIAGNOSIS — Z1211 Encounter for screening for malignant neoplasm of colon: Secondary | ICD-10-CM | POA: Insufficient documentation

## 2022-07-05 HISTORY — PX: COLONOSCOPY WITH PROPOFOL: SHX5780

## 2022-07-05 SURGERY — COLONOSCOPY WITH PROPOFOL
Anesthesia: General

## 2022-07-05 MED ORDER — PROPOFOL 10 MG/ML IV BOLUS
INTRAVENOUS | Status: DC | PRN
  Administered 2022-07-05: 80 mg via INTRAVENOUS

## 2022-07-05 MED ORDER — SODIUM CHLORIDE 0.9 % IV SOLN: INTRAVENOUS | Status: DC

## 2022-07-05 MED ORDER — LIDOCAINE HCL (CARDIAC) PF 100 MG/5ML IV SOSY
PREFILLED_SYRINGE | INTRAVENOUS | Status: DC | PRN
  Administered 2022-07-05: 50 mg via INTRAVENOUS

## 2022-07-05 MED ORDER — PROPOFOL 500 MG/50ML IV EMUL
INTRAVENOUS | Status: DC | PRN
  Administered 2022-07-05: 150 ug/kg/min via INTRAVENOUS

## 2022-07-05 NOTE — Anesthesia Preprocedure Evaluation (Signed)
Anesthesia Evaluation  Patient identified by MRN, date of birth, ID band Patient awake    Reviewed: Allergy & Precautions, H&P , NPO status , Patient's Chart, lab work & pertinent test results, reviewed documented beta blocker date and time   History of Anesthesia Complications Negative for: history of anesthetic complications  Airway Mallampati: II  TM Distance: >3 FB Neck ROM: full    Dental  (+) Caps, Teeth Intact   Pulmonary neg pulmonary ROS, neg shortness of breath, neg COPD,           Cardiovascular Exercise Tolerance: Good (-) angina(-) Past MI negative cardio ROS       Neuro/Psych PSYCHIATRIC DISORDERS negative neurological ROS     GI/Hepatic Neg liver ROS, GERD  ,  Endo/Other  neg diabetesHypothyroidism   Renal/GU negative Renal ROS  negative genitourinary   Musculoskeletal   Abdominal   Peds  Hematology negative hematology ROS (+)   Anesthesia Other Findings Past Medical History: No date: Hypercholesteremia No date: Hypothyroidism No date: Irritable bowel syndrome   Reproductive/Obstetrics negative OB ROS                             Anesthesia Physical  Anesthesia Plan  ASA: 2  Anesthesia Plan: General   Post-op Pain Management: Minimal or no pain anticipated   Induction: Intravenous  PONV Risk Score and Plan: 3 and Propofol infusion, TIVA and Ondansetron  Airway Management Planned: Nasal Cannula  Additional Equipment: None  Intra-op Plan:   Post-operative Plan:   Informed Consent: I have reviewed the patients History and Physical, chart, labs and discussed the procedure including the risks, benefits and alternatives for the proposed anesthesia with the patient or authorized representative who has indicated his/her understanding and acceptance.     Dental Advisory Given  Plan Discussed with: Anesthesiologist, CRNA and Surgeon  Anesthesia Plan Comments:  (Discussed risks of anesthesia with patient, including possibility of difficulty with spontaneous ventilation under anesthesia necessitating airway intervention, PONV, and rare risks such as cardiac or respiratory or neurological events, and allergic reactions. Discussed the role of CRNA in patient's perioperative care. Patient understands.)        Anesthesia Quick Evaluation

## 2022-07-05 NOTE — H&P (Signed)
Outpatient short stay form Pre-procedure 07/05/2022  [name], MD  Primary Physician: Jonetta Osgood, NP  Reason for visit:  Surveillance  History of present illness:    61 y/o gentleman with history of IBS-C, HLD, and hypothyroidism here for colonoscopy for history of polyps. Last colonoscopy in 2018 was unremarkable. Had adenoma on colonoscopy in 2015. No blood thinners. No family history of GI malignancies. No significant abdominal surgeries.    Current Facility-Administered Medications:    0.9 %  sodium chloride infusion, , Intravenous, Continuous, Quinlan Vollmer, Hilton Cork, MD, Last Rate: 20 mL/hr at 07/05/22 1155, Continued from Pre-op at 07/05/22 1155  Medications Prior to Admission  Medication Sig Dispense Refill Last Dose   atorvastatin (LIPITOR) 20 MG tablet TAKE 1 TABLET AT BEDTIME 90 tablet 3 07/04/2022   clidinium-chlordiazePOXIDE (LIBRAX) 5-2.5 MG capsule Take 1 capsule by mouth 3 (three) times daily before meals. 90 capsule 5 07/04/2022   esomeprazole (NEXIUM) 40 MG capsule TAKE 1 CAPSULE DAILY AT 12 NOON 90 capsule 3 07/04/2022   levothyroxine (SYNTHROID) 50 MCG tablet TAKE 1 TABLET EVERY MORNING BEFORE BREAKFAST 90 tablet 1 07/04/2022   linaclotide (LINZESS) 72 MCG capsule    07/04/2022   Magnesium 500 MG TABS Take 2 tablets by mouth daily.   07/04/2022   polyethylene glycol powder (GLYCOLAX/MIRALAX) 17 GM/SCOOP powder Take 1 Container by mouth every evening.   07/04/2022   Galcanezumab-gnlm 120 MG/ML SOAJ Inject into the skin.        No Known Allergies   Past Medical History:  Diagnosis Date   Chronic sinusitis    Constipation    GERD (gastroesophageal reflux disease)    Hypercholesteremia    Hypothyroidism    Irritable bowel syndrome     Review of systems:  Otherwise negative.    Physical Exam  Gen: Alert, oriented. Appears stated age.  HEENT: PERRLA. Lungs: No respiratory distress CV: RRR Abd: soft, benign, no masses Ext: No  edema    Planned procedures: Proceed with colonoscopy. The patient understands the nature of the planned procedure, indications, risks, alternatives and potential complications including but not limited to bleeding, infection, perforation, damage to internal organs and possible oversedation/side effects from anesthesia. The patient agrees and gives consent to proceed.  Please refer to procedure notes for findings, recommendations and patient disposition/instructions.     [name], MD [facility] Gastroenterology

## 2022-07-05 NOTE — Transfer of Care (Signed)
Immediate Anesthesia Transfer of Care Note  Patient: Roger Mclaughlin  Procedure(s) Performed: COLONOSCOPY WITH PROPOFOL  Patient Location: PACU  Anesthesia Type:General  Level of Consciousness: sedated  Airway & Oxygen Therapy: Patient Spontanous Breathing  Post-op Assessment: Report given to RN and Post -op Vital signs reviewed and stable  Post vital signs: Reviewed and stable  Last Vitals:  Vitals Value Taken Time  BP 102/80 07/05/22 1248  Temp    Pulse 78 07/05/22 1248  Resp 26 07/05/22 1248  SpO2 100 % 07/05/22 1248  Vitals shown include unvalidated device data.  Last Pain:  Vitals:   07/05/22 1139  TempSrc: Temporal  PainSc: 0-No pain         Complications: No notable events documented.

## 2022-07-05 NOTE — Interval H&P Note (Signed)
History and Physical Interval Note:  07/05/2022 12:17 PM  Roger Mclaughlin  has presented today for surgery, with the diagnosis of HX OF ADENOMATOUS POLYP OD COLOM.  The various methods of treatment have been discussed with the patient and family. After consideration of risks, benefits and other options for treatment, the patient has consented to  Procedure(s) with comments: COLONOSCOPY WITH PROPOFOL (N/A) - Please leave as a late morning appointment due to patient's transporation. as a surgical intervention.  The patient's history has been reviewed, patient examined, no change in status, stable for surgery.  I have reviewed the patient's chart and labs.  Questions were answered to the patient's satisfaction.     [name]  Ok to proceed with colonoscopy

## 2022-07-05 NOTE — Anesthesia Procedure Notes (Signed)
Date/Time: 07/05/2022 12:25 PM  Performed by: Johnna Acosta, CRNAPre-anesthesia Checklist: Patient identified, Emergency Drugs available, Suction available, Patient being monitored and Timeout performed Patient Re-evaluated:Patient Re-evaluated prior to induction Oxygen Delivery Method: Nasal cannula Preoxygenation: Pre-oxygenation with 100% oxygen Induction Type: IV induction

## 2022-07-05 NOTE — Op Note (Signed)
[facility] Gastroenterology Patient Name: Roger Mclaughlin Procedure Date: 07/05/2022 12:16 PM MRN: [id number] Account #: [id number] Date of Birth: 03-Jun-1961 Admit Type: Outpatient Age: 61 Room: [facility] ENDO ROOM 3 Gender: Male Note Status: Finalized Instrument Name: Jasper Riling [id number] Procedure:             Colonoscopy Indications:           High risk colon cancer surveillance: Personal history                         of colonic polyps, Last colonoscopy 5 years ago Providers:             Andrey Farmer MD, MD Referring MD:          Jonetta Osgood (Referring MD) Medicines:             Monitored Anesthesia Care Complications:         No immediate complications. Procedure:             Pre-Anesthesia Assessment:                        - Prior to the procedure, a History and Physical was                         performed, and patient medications and allergies were                         reviewed. The patient is competent. The risks and                         benefits of the procedure and the sedation options and                         risks were discussed with the patient. All questions                         were answered and informed consent was obtained.                         Patient identification and proposed procedure were                         verified by the physician, the nurse, the                         anesthesiologist, the anesthetist and the technician                         in the endoscopy suite. Mental Status Examination:                         alert and oriented. Airway Examination: normal                         oropharyngeal airway and neck mobility. Respiratory                         Examination: clear to auscultation. CV Examination:  normal. Prophylactic Antibiotics: The patient does not                         require prophylactic antibiotics. Prior                         Anticoagulants: The patient has  taken no previous                         anticoagulant or antiplatelet agents. ASA Grade                         Assessment: II - A patient with mild systemic disease.                         After reviewing the risks and benefits, the patient                         was deemed in satisfactory condition to undergo the                         procedure. The anesthesia plan was to use monitored                         anesthesia care (MAC). Immediately prior to                         administration of medications, the patient was                         re-assessed for adequacy to receive sedatives. The                         heart rate, respiratory rate, oxygen saturations,                         blood pressure, adequacy of pulmonary ventilation, and                         response to care were monitored throughout the                         procedure. The physical status of the patient was                         re-assessed after the procedure.                        After obtaining informed consent, the colonoscope was                         passed under direct vision. Throughout the procedure,                         the patient's blood pressure, pulse, and oxygen                         saturations were monitored continuously. The  Colonoscope was introduced through the anus and                         advanced to the the cecum, identified by appendiceal                         orifice and ileocecal valve. The colonoscopy was                         performed without difficulty. The patient tolerated                         the procedure well. The quality of the bowel                         preparation was good. Findings:      The perianal and digital rectal examinations were normal.      A few small-mouthed diverticula were found in the ascending colon.      Multiple small-mouthed diverticula were found in the sigmoid colon.      Internal hemorrhoids  were found during retroflexion. The hemorrhoids       were Grade I (internal hemorrhoids that do not prolapse).      The exam was otherwise without abnormality on direct and retroflexion       views. Impression:            - Diverticulosis in the ascending colon.                        - Diverticulosis in the sigmoid colon.                        - Internal hemorrhoids.                        - The examination was otherwise normal on direct and                         retroflexion views.                        - No specimens collected. Recommendation:        - Discharge patient to home.                        - Resume previous diet.                        - Continue present medications.                        - Repeat colonoscopy in 10 years for surveillance.                        - Return to referring physician as previously                         scheduled. Procedure Code(s):     --- Professional ---                        J6283, Colorectal cancer screening;  colonoscopy on                         individual at high risk Diagnosis Code(s):     --- Professional ---                        Z86.010, Personal history of colonic polyps                        K64.0, First degree hemorrhoids                        K57.30, Diverticulosis of large intestine without                         perforation or abscess without bleeding CPT copyright 2019 [employer] Medical Association. All rights reserved. The codes documented in this report are preliminary and upon coder review may  be revised to meet current compliance requirements. Andrey Farmer MD, MD 07/05/2022 12:48:48 PM Number of Addenda: 0 Note Initiated On: 07/05/2022 12:16 PM Scope Withdrawal Time: 0 hours 9 minutes 28 seconds  Total Procedure Duration: 0 hours 13 minutes 21 seconds  Estimated Blood Loss:  Estimated blood loss: none.      [facility]

## 2022-07-08 ENCOUNTER — Other Ambulatory Visit: Payer: Self-pay | Admitting: Nurse Practitioner

## 2022-07-08 DIAGNOSIS — Z760 Encounter for issue of repeat prescription: Secondary | ICD-10-CM

## 2022-07-08 NOTE — Anesthesia Postprocedure Evaluation (Signed)
Anesthesia Post Note  Patient: Roger Mclaughlin  Procedure(s) Performed: COLONOSCOPY WITH PROPOFOL  Patient location during evaluation: Endoscopy Anesthesia Type: General Level of consciousness: awake and alert Pain management: pain level controlled Vital Signs Assessment: post-procedure vital signs reviewed and stable Respiratory status: spontaneous breathing, nonlabored ventilation, respiratory function stable and patient connected to nasal cannula oxygen Cardiovascular status: blood pressure returned to baseline and stable Postop Assessment: no apparent nausea or vomiting Anesthetic complications: no   No notable events documented.   Last Vitals:  Vitals:   07/05/22 1249 07/05/22 1309  BP: 102/80   Pulse:  68  Resp: 20 16  Temp: 36.8 C   SpO2:      Last Pain:  Vitals:   07/06/22 1020  TempSrc:   PainSc: 0-No pain                 Dimas Millin

## 2022-08-26 ENCOUNTER — Encounter: Payer: Self-pay | Admitting: Nurse Practitioner

## 2022-08-26 ENCOUNTER — Ambulatory Visit (INDEPENDENT_AMBULATORY_CARE_PROVIDER_SITE_OTHER): Admitting: Nurse Practitioner

## 2022-08-26 VITALS — BP 132/88 | HR 79 | Temp 97.1°F | Resp 16 | Ht 67.0 in | Wt 170.5 lb

## 2022-08-26 DIAGNOSIS — M722 Plantar fascial fibromatosis: Secondary | ICD-10-CM | POA: Diagnosis not present

## 2022-08-26 NOTE — Progress Notes (Signed)
[facility] [address] [place], [place] [address]  Internal MEDICINE  Office Visit Note  Patient Name: Roger Mclaughlin  [id number]  [id number]  Date of Service: 08/26/2022  Chief Complaint  Patient presents with   Acute Visit    Bilateral foot pain.      HPI Roger Mclaughlin presents for an acute sick visit for bilateral foot pain x4-6 weeks  Pain in heels and radiating through arch, walking a lot makes it worse, certain shoes make it worse.     Current Medication:  Outpatient Encounter Medications as of 08/26/2022  Medication Sig   atorvastatin (LIPITOR) 20 MG tablet TAKE 1 TABLET AT BEDTIME   clidinium-chlordiazePOXIDE (LIBRAX) 5-2.5 MG capsule Take 1 capsule by mouth 3 (three) times daily before meals.   esomeprazole (NEXIUM) 40 MG capsule TAKE 1 CAPSULE DAILY AT 12 NOON   Galcanezumab-gnlm 120 MG/ML SOAJ Inject into the skin.   levothyroxine (SYNTHROID) 50 MCG tablet TAKE 1 TABLET EVERY MORNING BEFORE BREAKFAST   linaclotide (LINZESS) 72 MCG capsule    Magnesium 500 MG TABS Take 2 tablets by mouth daily.   polyethylene glycol powder (GLYCOLAX/MIRALAX) 17 GM/SCOOP powder Take 1 Container by mouth every evening.   No facility-administered encounter medications on file as of 08/26/2022.      Medical History: Past Medical History:  Diagnosis Date   Chronic sinusitis    Constipation    GERD (gastroesophageal reflux disease)    Hypercholesteremia    Hypothyroidism    Irritable bowel syndrome      Vital Signs: BP 132/88   Pulse 79   Temp (!) 97.1 F (36.2 C)   Resp 16   Ht 5\' 7"  (1.702 m)   Wt 170 lb 8 oz (77.3 kg)   SpO2 99%   BMI 26.70 kg/m    Review of Systems  Respiratory:  Negative for cough, chest tightness, shortness of breath and wheezing.   Cardiovascular: Negative.  Negative for chest pain and palpitations.  Musculoskeletal:  Positive for arthralgias (bilateral foot and heel pain).    Physical Exam Vitals reviewed.   Musculoskeletal:     Right foot: Decreased range of motion.     Left foot: Decreased range of motion.       Feet:  Feet:     Comments: Pain in heel and radiates through arch      Assessment/Plan: 1. Bilateral plantar fasciitis Referral ordered and handout with information and stretches to rehab his feet provided.  - Ambulatory referral to Podiatry   General Counseling: understanding of the findings of todays visit and agrees with plan of treatment. I have discussed any further diagnostic evaluation that may be needed or ordered today. We also reviewed his medications today. he has been encouraged to call the office with any questions or concerns that should arise related to todays visit.    Counseling:    Orders Placed This Encounter  Procedures   Ambulatory referral to Podiatry    No orders of the defined types were placed in this encounter.   Return if symptoms worsen or fail to improve.  [place] Controlled Substance Database was reviewed by me for overdose risk score (ORS)  Time spent:20 Minutes Time spent with patient included reviewing progress notes, labs, imaging studies, and discussing plan for follow up.   This patient was seen by Donzetta Starch, FNP-C in collaboration with Dr. Sallyanne Kuster as a part of collaborative care agreement.  Quincy Prisco R. Beverely Risen, MSN, FNP-C Internal Medicine

## 2022-08-28 ENCOUNTER — Ambulatory Visit (INDEPENDENT_AMBULATORY_CARE_PROVIDER_SITE_OTHER)

## 2022-08-28 ENCOUNTER — Encounter: Payer: Self-pay | Admitting: Podiatry

## 2022-08-28 ENCOUNTER — Ambulatory Visit: Admitting: Podiatry

## 2022-08-28 DIAGNOSIS — M722 Plantar fascial fibromatosis: Secondary | ICD-10-CM

## 2022-08-28 MED ORDER — MELOXICAM 15 MG PO TABS: 15.0000 mg | ORAL_TABLET | Freq: Every day | ORAL | 3 refills | Status: DC

## 2022-08-28 MED ORDER — METHYLPREDNISOLONE 4 MG PO TBPK: ORAL_TABLET | ORAL | 0 refills | Status: DC

## 2022-08-28 MED ORDER — TRIAMCINOLONE ACETONIDE 40 MG/ML IJ SUSP
40.0000 mg | Freq: Once | INTRAMUSCULAR | Status: AC
  Administered 2022-08-28: 40 mg

## 2022-08-28 NOTE — Progress Notes (Signed)
Subjective:  Patient ID: Roger Mclaughlin, male    DOB: [birth year],  MRN: [id number] HPI Chief Complaint  Patient presents with   Foot Pain    Plantar heel bilateral (L>R) - aching x 4-6 weeks, AM pain, stands all day at work, so has made really uncomfortable, tried new shoes   New Patient (Initial Visit)    61 y.o. male presents with the above complaint.   ROS: Denies fever chills nausea vomit muscle aches pains calf pain back pain chest pain shortness of breath.  The majority of his pain is located in the plantar central arch deep to the fascia.  States that it hurts worse when he is standing on it rather than pushing on it.  Past Medical History:  Diagnosis Date   Chronic sinusitis    Constipation    GERD (gastroesophageal reflux disease)    Hypercholesteremia    Hypothyroidism    Irritable bowel syndrome    Past Surgical History:  Procedure Laterality Date   COLONOSCOPY     COLONOSCOPY WITH PROPOFOL N/A 01/23/2017   Procedure: COLONOSCOPY WITH PROPOFOL;  Surgeon: Wyline Mood, MD;  Location: [facility] ENDOSCOPY;  Service: Endoscopy;  Laterality: N/A;   COLONOSCOPY WITH PROPOFOL N/A 07/05/2022   Procedure: COLONOSCOPY WITH PROPOFOL;  Surgeon: Regis Bill, MD;  Location: [facility] ENDOSCOPY;  Service: Endoscopy;  Laterality: N/A;  Please leave as a late morning appointment due to patient's transporation.   ESOPHAGOGASTRODUODENOSCOPY     WISDOM TOOTH EXTRACTION      Current Outpatient Medications:    gabapentin (NEURONTIN) 100 MG capsule, Take 100 mg twice a day for one week, then increase to 200 mg(2 tablets) twice a day and continue, Disp: , Rfl:    meloxicam (MOBIC) 15 MG tablet, Take 1 tablet (15 mg total) by mouth daily., Disp: 30 tablet, Rfl: 3   methylPREDNISolone (MEDROL DOSEPAK) 4 MG TBPK tablet, 6 day dose pack - take as directed, Disp: 21 tablet, Rfl: 0   atorvastatin (LIPITOR) 20 MG tablet, TAKE 1 TABLET AT BEDTIME, Disp: 90 tablet, Rfl: 3   esomeprazole (NEXIUM) 40  MG capsule, TAKE 1 CAPSULE DAILY AT 12 NOON, Disp: 90 capsule, Rfl: 3   levothyroxine (SYNTHROID) 50 MCG tablet, TAKE 1 TABLET EVERY MORNING BEFORE BREAKFAST, Disp: 90 tablet, Rfl: 3   linaclotide (LINZESS) 72 MCG capsule, , Disp: , Rfl:    Magnesium 500 MG TABS, Take 2 tablets by mouth daily., Disp: , Rfl:    polyethylene glycol powder (GLYCOLAX/MIRALAX) 17 GM/SCOOP powder, Take 1 Container by mouth every evening., Disp: , Rfl:   No Known Allergies Review of Systems Objective:  There were no vitals filed for this visit.  General: Well developed, nourished, in no acute distress, alert and oriented x3   Dermatological: Skin is warm, dry and supple bilateral. Nails x 10 are well maintained; remaining integument appears unremarkable at this time. There are no open sores, no preulcerative lesions, no rash or signs of infection present.  Vascular: Dorsalis Pedis artery and Posterior Tibial artery pedal pulses are 2/4 bilateral with immedate capillary fill time. Pedal hair growth present. No varicosities and no lower extremity edema present bilateral.   Neruologic: Grossly intact via light touch bilateral. Vibratory intact via tuning fork bilateral. Protective threshold with Semmes Wienstein monofilament intact to all pedal sites bilateral. Patellar and Achilles deep tendon reflexes 2+ bilateral. No Babinski or clonus noted bilateral.   Musculoskeletal: No gross boney pedal deformities bilateral. No pain, crepitus, or limitation noted with foot  and ankle range of motion bilateral. Muscular strength 5/5 in all groups tested bilateral.  Pain on palpation just distal to the plantar fascia calcaneal insertion site particularly the muscle bellies of the intrinsic muscles plantarly.  Gait: Unassisted, Nonantalgic.    Radiographs:  Radiographs taken today demonstrate mild pes planovalgus.  No fractures identified.  No coalitions identified.  Assessment & Plan:   Assessment: Pes planovalgus with  Planter fasciitis bilateral.  Plan: Injected just proximal to the medial longitudinal arch 20 mg Kenalog 5 mg Marcaine bilateral.  Started on methylprednisolone to be followed by meloxicam.  Discussed appropriate shoe gear stretching exercise ice therapy sugar modifications.  May need to consider orthotics.     Shalissa Easterwood T. [place], [place]

## 2022-10-09 ENCOUNTER — Ambulatory Visit: Admitting: Podiatry

## 2022-10-09 ENCOUNTER — Encounter: Payer: Self-pay | Admitting: Podiatry

## 2022-10-09 VITALS — BP 134/86 | HR 86

## 2022-10-09 DIAGNOSIS — M722 Plantar fascial fibromatosis: Secondary | ICD-10-CM

## 2022-10-09 NOTE — Progress Notes (Signed)
He presents today for follow-up of his bilateral Planter fasciitis he states that is better but is not completely well yet he states that he discontinued the use of his meloxicam because he did not need pain medication.  Objective: Vital signs stable he is alert and oriented x 3.  Pulses are palpable.  He has some deep pain on palpation medial calcaneal tubercles much less than last visit.  Assessment: Cannot rule out some neuropathy to his foot bilaterally but he does have resolving plantar fasciitis.  Plan: At this point I encouraged him to get started back on his meloxicam he does have gabapentin at home I encouraged him to try that to see if that made any difference as far as the funny sensation on his skin.  I also recommended orthotics he was casted today.

## 2022-10-28 ENCOUNTER — Ambulatory Visit (INDEPENDENT_AMBULATORY_CARE_PROVIDER_SITE_OTHER): Admitting: Nurse Practitioner

## 2022-10-28 ENCOUNTER — Encounter: Payer: Self-pay | Admitting: Nurse Practitioner

## 2022-10-28 VITALS — BP 132/84 | HR 75 | Temp 97.3°F | Resp 16 | Ht 67.0 in | Wt 167.2 lb

## 2022-10-28 DIAGNOSIS — R3912 Poor urinary stream: Secondary | ICD-10-CM | POA: Diagnosis not present

## 2022-10-28 DIAGNOSIS — R7301 Impaired fasting glucose: Secondary | ICD-10-CM

## 2022-10-28 DIAGNOSIS — R300 Dysuria: Secondary | ICD-10-CM

## 2022-10-28 DIAGNOSIS — Z125 Encounter for screening for malignant neoplasm of prostate: Secondary | ICD-10-CM

## 2022-10-28 NOTE — Progress Notes (Signed)
[facility] [place], [place] [address]  Internal MEDICINE  Office Visit Note  Patient Name: Roger Mclaughlin  [id number]  [id number]  Date of Service: 10/28/2022  Chief Complaint  Patient presents with   Acute Visit    Prostate issues.     HPI Roger Mclaughlin presents for an acute sick visit for changes in urination Onset of symptoms was 6 weeks ago, worried about his prostate.  Weakened stream of urine, increased frequency, increased urgency, split urine stream Has been getting up a couple of time per night to urinate which is new for him.  No pain with urination --also worried he might have diabetes due to significant neuropathy in his feet. Wants glucose check again to screen for diabetes.     Current Medication:  Outpatient Encounter Medications as of 10/28/2022  Medication Sig   atorvastatin (LIPITOR) 20 MG tablet TAKE 1 TABLET AT BEDTIME   esomeprazole (NEXIUM) 40 MG capsule TAKE 1 CAPSULE DAILY AT 12 NOON   levothyroxine (SYNTHROID) 50 MCG tablet TAKE 1 TABLET EVERY MORNING BEFORE BREAKFAST   linaclotide (LINZESS) 72 MCG capsule    Magnesium 500 MG TABS Take 2 tablets by mouth daily.   meloxicam (MOBIC) 15 MG tablet Take 1 tablet (15 mg total) by mouth daily.   polyethylene glycol powder (GLYCOLAX/MIRALAX) 17 GM/SCOOP powder Take 1 Container by mouth every evening.   No facility-administered encounter medications on file as of 10/28/2022.      Medical History: Past Medical History:  Diagnosis Date   Chronic sinusitis    Constipation    GERD (gastroesophageal reflux disease)    Hypercholesteremia    Hypothyroidism    Irritable bowel syndrome      Vital Signs: BP 132/84   Pulse 75   Temp (!) 97.3 F (36.3 C)   Resp 16   Ht 5' 7"$  (1.702 m)   Wt 167 lb 3.2 oz (75.8 kg)   SpO2 99%   BMI 26.19 kg/m    Review of Systems  Constitutional: Negative.  Negative for appetite change, fatigue and fever.  Respiratory:  Negative for cough,  chest tightness, shortness of breath and wheezing.   Cardiovascular: Negative.  Negative for chest pain and palpitations.  Endocrine: Positive for polyuria.  Genitourinary:  Positive for difficulty urinating, dysuria, frequency and urgency. Negative for enuresis, flank pain, genital sores, hematuria, penile discharge, penile pain, penile swelling, scrotal swelling and testicular pain.       Nocturia, weak urine stream, split urine stream.  Musculoskeletal: Negative.  Negative for arthralgias, back pain, gait problem, myalgias and neck stiffness.    Physical Exam Vitals reviewed.  Constitutional:      General: He is not in acute distress.    Appearance: Normal appearance. He is not ill-appearing.  HENT:     Head: Normocephalic and atraumatic.  Eyes:     Pupils: Pupils are equal, round, and reactive to light.  Cardiovascular:     Rate and Rhythm: Normal rate and regular rhythm.  Pulmonary:     Effort: Pulmonary effort is normal. No respiratory distress.  Neurological:     Mental Status: He is alert and oriented to person, place, and time.  Psychiatric:        Mood and Affect: Mood normal.        Behavior: Behavior normal.       Assessment/Plan: 1. Weak urine stream Check renal function test, PSA and urinalysis. Refer to urology for further evaluation.  - Ambulatory referral  to Urology - CMP14+EGFR - PSA Total (Reflex To Free) - Urinalysis, Routine w reflex microscopic  2. Impaired fasting glucose Prior abnormal fasting glucose, also patient has polyuria and neuropathy at times, screen for diabetes.  - CMP14+EGFR - Hgb A1C w/o eAG  3. Dysuria Rule out UTI - Urinalysis, Routine w reflex microscopic  4. Screening for prostate cancer Screen for abnormal PSA, refer to urology for further evaluation regardless of PSA level.  - Ambulatory referral to Urology - PSA Total (Reflex To Free)   General Counseling: Freddy Finner understanding of the findings of todays visit  and agrees with plan of treatment. I have discussed any further diagnostic evaluation that may be needed or ordered today. We also reviewed his medications today. he has been encouraged to call the office with any questions or concerns that should arise related to todays visit.    Counseling:    Orders Placed This Encounter  Procedures   CMP14+EGFR   Hgb A1C w/o eAG   PSA Total (Reflex To Free)   Urinalysis, Routine w reflex microscopic   Ambulatory referral to Urology    No orders of the defined types were placed in this encounter.   Return if symptoms worsen or fail to improve, for referred to urology for further evaluation and f/u otherwise as needed .  [place] Controlled Substance Database was reviewed by me for overdose risk score (ORS)  Time spent:30 Minutes Time spent with patient included reviewing progress notes, labs, imaging studies, and discussing plan for follow up.   This patient was seen by Jonetta Osgood, FNP-C in collaboration with Dr. Clayborn Bigness as a part of collaborative care agreement.  Anayah Arvanitis R. Valetta Fuller, MSN, FNP-C Internal Medicine

## 2022-10-29 LAB — CMP14+EGFR
ALT: 35 IU/L (ref 0–44)
AST: 26 IU/L (ref 0–40)
Albumin/Globulin Ratio: 2 (ref 1.2–2.2)
Albumin: 4.3 g/dL (ref 3.8–4.9)
Alkaline Phosphatase: 79 IU/L (ref 44–121)
BUN/Creatinine Ratio: 12 (ref 10–24)
BUN: 15 mg/dL (ref 8–27)
Bilirubin Total: 0.5 mg/dL (ref 0.0–1.2)
CO2: 22 mmol/L (ref 20–29)
Calcium: 9.5 mg/dL (ref 8.6–10.2)
Chloride: 102 mmol/L (ref 96–106)
Creatinine, Ser: 1.22 mg/dL (ref 0.76–1.27)
Globulin, Total: 2.2 g/dL (ref 1.5–4.5)
Glucose: 95 mg/dL (ref 70–99)
Potassium: 4.7 mmol/L (ref 3.5–5.2)
Sodium: 139 mmol/L (ref 134–144)
Total Protein: 6.5 g/dL (ref 6.0–8.5)
eGFR: 68 mL/min/{1.73_m2} (ref >59)

## 2022-10-29 LAB — URINALYSIS, ROUTINE W REFLEX MICROSCOPIC
Bilirubin, UA: NEGATIVE
Glucose, UA: NEGATIVE
Ketones, UA: NEGATIVE
Leukocytes,UA: NEGATIVE
Nitrite, UA: NEGATIVE
Protein,UA: NEGATIVE
RBC, UA: NEGATIVE
Specific Gravity, UA: 1.022 (ref 1.005–1.030)
Urobilinogen, Ur: 0.2 mg/dL (ref 0.2–1.0)
pH, UA: 7 (ref 5.0–7.5)

## 2022-10-29 LAB — PSA TOTAL (REFLEX TO FREE): Prostate Specific Ag, Serum: 1.2 ng/mL (ref 0.0–4.0)

## 2022-10-29 LAB — HGB A1C W/O EAG: Hgb A1c MFr Bld: 5.9 % — ABNORMAL HIGH (ref 4.8–5.6)

## 2022-11-06 ENCOUNTER — Ambulatory Visit (INDEPENDENT_AMBULATORY_CARE_PROVIDER_SITE_OTHER): Admitting: Urology

## 2022-11-06 ENCOUNTER — Encounter: Payer: Self-pay | Admitting: Urology

## 2022-11-06 VITALS — BP 147/85 | HR 77 | Ht 67.5 in | Wt 160.0 lb

## 2022-11-06 DIAGNOSIS — R3915 Urgency of urination: Secondary | ICD-10-CM | POA: Diagnosis not present

## 2022-11-06 DIAGNOSIS — Z125 Encounter for screening for malignant neoplasm of prostate: Secondary | ICD-10-CM | POA: Diagnosis not present

## 2022-11-06 DIAGNOSIS — N401 Enlarged prostate with lower urinary tract symptoms: Secondary | ICD-10-CM

## 2022-11-06 DIAGNOSIS — R3912 Poor urinary stream: Secondary | ICD-10-CM

## 2022-11-06 DIAGNOSIS — R350 Frequency of micturition: Secondary | ICD-10-CM | POA: Diagnosis not present

## 2022-11-06 LAB — BLADDER SCAN AMB NON-IMAGING

## 2022-11-06 NOTE — Progress Notes (Signed)
   11/06/22 10:37 AM   [name] 03/05/1961 [id number]  CC: Lower urinary tract symptoms, PSA screening  HPI: I saw [name] today for the above issues.  He reports a long history of intermittent urinary symptoms with some urgency, frequency, weak stream, and split stream.  This has resolved spontaneously in the past.  Has had about 6 weeks of symptoms, and over the last week things seem to have improved somewhat.  He has nocturia 0-1 time overnight.  He consumes a large volume of fluids throughout the day including artificially sweetened waters and carbonated drinks, as well as wine before bed.  He feels he does not empty completely.  PSA is normal, most recently 1.2.  He has problems with constipation, and feels this exacerbates his urinary symptoms.  Multiple urine samples have been benign, PVR today normal at 135m.   PMH: Past Medical History:  Diagnosis Date   Chronic sinusitis    Constipation    GERD (gastroesophageal reflux disease)    Hypercholesteremia    Hypothyroidism    Irritable bowel syndrome     Surgical History: Past Surgical History:  Procedure Laterality Date   COLONOSCOPY     COLONOSCOPY WITH PROPOFOL N/A 01/23/2017   Procedure: COLONOSCOPY WITH PROPOFOL;  Surgeon: AJonathon Bellows MD;  [facility];  Service: Endoscopy;  Laterality: N/A;   COLONOSCOPY WITH PROPOFOL N/A 07/05/2022   Procedure: COLONOSCOPY WITH PROPOFOL;  Surgeon: LLesly Rubenstein MD;  Location: [facility] ENDOSCOPY;  Service: Endoscopy;  Laterality: N/A;  Please leave as a late morning appointment due to patient's transporation.   ESOPHAGOGASTRODUODENOSCOPY     WISDOM TOOTH EXTRACTION       Family History: Family History  Problem Relation Age of Onset   Irritable bowel syndrome Mother    Hypertension Mother    Diabetes Mother    Prostate cancer Father    Hypertension Father    Diabetes Father    Irritable bowel syndrome Sister     Social History:  reports that he has  never smoked. He has never used smokeless tobacco. He reports current alcohol use of about 7.0 standard drinks of alcohol per week. He reports that he does not use drugs.  Physical Exam: BP (!) 147/85 (BP Location: Left Arm, Patient Position: Sitting, Cuff Size: Large)   Pulse 77   Ht 5' 7.5" (1.715 m)   Wt 160 lb (72.6 kg)   SpO2 98%   BMI 24.69 kg/m    Constitutional:  Alert and oriented, No acute distress. Cardiovascular: No clubbing, cyanosis, or edema. Respiratory: Normal respiratory effort, no increased work of breathing. GI: Abdomen is soft, nontender, nondistended, no abdominal masses GU: Phallus with patent meatus, no lesions  Assessment & Plan:   62year old male with mild lower urinary tract symptoms of some occasional frequency and urgency and occasional weak stream.  We reviewed possible etiologies including his high-volume fluid intake, bladder irritants, BPH, OAB, urethral stricture disease.  PSA is normal, and reassurance provided, and we also reviewed the AUA guidelines regarding the risks and benefits of PSA screening.  We discussed options including trial of behavioral strategies of avoiding bladder irritants and decreasing fluid intake, trial of Flomax, or cystoscopy.  Using shared decision making he would like to try behavioral strategies first.  RTC 3 months PVR and symptom check  BNickolas Madrid MD 11/06/2022  [facility] [address] [place] [phone]

## 2022-11-06 NOTE — Patient Instructions (Signed)
Decrease your fluid intake.  Your body does a very good job of regulating fluid status, and will do a good job of telling you to be thirsty if you are dehydrated or need more fluids.  Avoid diet drinks, artificially sweetened drinks, carbonated drinks, as these can all worsen urinary frequency and urgency.

## 2022-11-07 NOTE — Progress Notes (Signed)
All labs are normal except his A1c which is elevated at 5.9 which is consistent with prediabetes. He has an upcoming appointment on 3/22. We will discuss this in more detail then

## 2022-12-04 ENCOUNTER — Ambulatory Visit (INDEPENDENT_AMBULATORY_CARE_PROVIDER_SITE_OTHER): Admitting: Podiatry

## 2022-12-04 DIAGNOSIS — M722 Plantar fascial fibromatosis: Secondary | ICD-10-CM

## 2022-12-04 NOTE — Progress Notes (Signed)
Patient presents today to pick up custom molded foot orthotics, diagnosed with plantar fasciitis by Dr. Hyatt.   Orthotics were dispensed and fit was satisfactory. Reviewed instructions for break-in and wear. Written instructions given to patient.  Patient will follow up as needed.      

## 2022-12-06 ENCOUNTER — Encounter: Payer: Self-pay | Admitting: Nurse Practitioner

## 2022-12-06 ENCOUNTER — Ambulatory Visit (INDEPENDENT_AMBULATORY_CARE_PROVIDER_SITE_OTHER): Admitting: Nurse Practitioner

## 2022-12-06 VITALS — BP 119/79 | HR 73 | Temp 98.1°F | Resp 16 | Ht 67.5 in | Wt 168.8 lb

## 2022-12-06 DIAGNOSIS — R3912 Poor urinary stream: Secondary | ICD-10-CM

## 2022-12-06 DIAGNOSIS — N1831 Chronic kidney disease, stage 3a: Secondary | ICD-10-CM | POA: Diagnosis not present

## 2022-12-06 DIAGNOSIS — R7303 Prediabetes: Secondary | ICD-10-CM | POA: Diagnosis not present

## 2022-12-06 NOTE — Progress Notes (Signed)
[facility] [place], [place] [address]  Internal MEDICINE  Office Visit Note  Patient Name: Roger Mclaughlin  [id number]  [id number]  Date of Service: 12/06/2022  Chief Complaint  Patient presents with   Hyperlipidemia   Gastroesophageal Reflux   Follow-up    HPI Dorse presents for a follow-up visit for review labs  Prediabetes -- A1c elevated 5.9, glucose on labs was 95 Creatinine is normal, after being chronically elevated. GFR is normal  Psa is normal     Current Medication: Outpatient Encounter Medications as of 12/06/2022  Medication Sig   atorvastatin (LIPITOR) 20 MG tablet TAKE 1 TABLET AT BEDTIME   esomeprazole (NEXIUM) 40 MG capsule TAKE 1 CAPSULE DAILY AT 12 NOON   levothyroxine (SYNTHROID) 50 MCG tablet TAKE 1 TABLET EVERY MORNING BEFORE BREAKFAST   linaclotide (LINZESS) 72 MCG capsule    Magnesium 500 MG TABS Take 2 tablets by mouth daily.   meloxicam (MOBIC) 15 MG tablet Take 1 tablet (15 mg total) by mouth daily.   polyethylene glycol powder (GLYCOLAX/MIRALAX) 17 GM/SCOOP powder Take 1 Container by mouth every evening.   No facility-administered encounter medications on file as of 12/06/2022.    Surgical History: Past Surgical History:  Procedure Laterality Date   COLONOSCOPY     COLONOSCOPY WITH PROPOFOL N/A 01/23/2017   Procedure: COLONOSCOPY WITH PROPOFOL;  Surgeon: Jonathon Bellows, MD;  Location: [facility] ENDOSCOPY;  Service: Endoscopy;  Laterality: N/A;   COLONOSCOPY WITH PROPOFOL N/A 07/05/2022   Procedure: COLONOSCOPY WITH PROPOFOL;  Surgeon: [name], MD;  Location: [facility] ENDOSCOPY;  Service: Endoscopy;  Laterality: N/A;  Please leave as a late morning appointment due to patient's transporation.   ESOPHAGOGASTRODUODENOSCOPY     WISDOM TOOTH EXTRACTION      Medical History: Past Medical History:  Diagnosis Date   Chronic sinusitis    Constipation    GERD (gastroesophageal reflux disease)    Hypercholesteremia     Hypothyroidism    Irritable bowel syndrome     Family History: Family History  Problem Relation Age of Onset   Irritable bowel syndrome Mother    Hypertension Mother    Diabetes Mother    Prostate cancer Father    Hypertension Father    Diabetes Father    Irritable bowel syndrome Sister     Social History   Socioeconomic History   Marital status: Single    Spouse name: Not on file   Number of children: Not on file   Years of education: Not on file   Highest education level: Not on file  Occupational History   Not on file  Tobacco Use   Smoking status: Never   Smokeless tobacco: Never  Vaping Use   Vaping Use: Never used  Substance and Sexual Activity   Alcohol use: Yes    Alcohol/week: 7.0 standard drinks of alcohol    Types: 7 Glasses of wine per week    Comment: 1 glass of wine a day   Drug use: No   Sexual activity: Yes    Birth control/protection: Condom  Other Topics Concern   Not on file  Social History Narrative   Not on file   Social Determinants of Health   Financial Resource Strain: Not on file  Food Insecurity: Not on file  Transportation Needs: Not on file  Physical Activity: Not on file  Stress: Not on file  Social Connections: Not on file  Intimate Partner Violence: Not on file  Review of Systems  Constitutional: Negative.  Negative for appetite change, fatigue and fever.  Respiratory:  Negative for cough, chest tightness, shortness of breath and wheezing.   Cardiovascular: Negative.  Negative for chest pain and palpitations.  Endocrine: Positive for polyuria.  Genitourinary:  Positive for difficulty urinating, dysuria, frequency and urgency. Negative for enuresis, flank pain, genital sores, hematuria, penile discharge, penile pain, penile swelling, scrotal swelling and testicular pain.       Nocturia, weak urine stream, split urine stream.  Musculoskeletal: Negative.  Negative for arthralgias, back pain, gait problem, myalgias and  neck stiffness.    Vital Signs: BP 119/79   Pulse 73   Temp 98.1 F (36.7 C)   Resp 16   Ht 5' 7.5" (1.715 m)   Wt 168 lb 12.8 oz (76.6 kg)   SpO2 94%   BMI 26.05 kg/m    Physical Exam Vitals reviewed.  Constitutional:      General: He is not in acute distress.    Appearance: Normal appearance. He is not ill-appearing.  HENT:     Head: Normocephalic and atraumatic.  Eyes:     Pupils: Pupils are equal, round, and reactive to light.  Cardiovascular:     Rate and Rhythm: Normal rate and regular rhythm.  Pulmonary:     Effort: Pulmonary effort is normal. No respiratory distress.  Neurological:     Mental Status: He is alert and oriented to person, place, and time.  Psychiatric:        Mood and Affect: Mood normal.        Behavior: Behavior normal.        Assessment/Plan: 1. Weak urine stream PSA is normal, already referred to urology for further evaluation  2. Prediabetes A1c 5.9, information on diet modifications provided to patient  3. Stage 3a chronic kidney disease ([place]) Improved for now -- cre 1.22 and normal GFR   General Counseling: Freddy Finner understanding of the findings of todays visit and agrees with plan of treatment. I have discussed any further diagnostic evaluation that may be needed or ordered today. We also reviewed his medications today. he has been encouraged to call the office with any questions or concerns that should arise related to todays visit.    No orders of the defined types were placed in this encounter.   No orders of the defined types were placed in this encounter.   Return for previously scheduled, CPE, Capricia Serda PCP in 6 months.   Total time spent:30 Minutes Time spent includes review of chart, medications, test results, and follow up plan with the patient.   [place] Controlled Substance Database was reviewed by me.  This patient was seen by Jonetta Osgood, FNP-C in collaboration with Dr. Clayborn Bigness as a part of  collaborative care agreement.   Sherry Blackard R. Valetta Fuller, MSN, FNP-C Internal medicine

## 2022-12-13 ENCOUNTER — Ambulatory Visit: Admitting: Nurse Practitioner

## 2023-01-27 ENCOUNTER — Other Ambulatory Visit: Payer: Self-pay | Admitting: Podiatry

## 2023-02-11 ENCOUNTER — Encounter: Payer: Self-pay | Admitting: Nurse Practitioner

## 2023-02-11 ENCOUNTER — Ambulatory Visit (INDEPENDENT_AMBULATORY_CARE_PROVIDER_SITE_OTHER): Admitting: Nurse Practitioner

## 2023-02-11 ENCOUNTER — Ambulatory Visit: Admitting: Urology

## 2023-02-11 VITALS — BP 126/92 | HR 98 | Temp 98.2°F | Resp 16 | Ht 67.5 in | Wt 167.6 lb

## 2023-02-11 DIAGNOSIS — Z79899 Other long term (current) drug therapy: Secondary | ICD-10-CM

## 2023-02-11 DIAGNOSIS — G4700 Insomnia, unspecified: Secondary | ICD-10-CM | POA: Diagnosis not present

## 2023-02-11 DIAGNOSIS — Z760 Encounter for issue of repeat prescription: Secondary | ICD-10-CM

## 2023-02-11 DIAGNOSIS — K581 Irritable bowel syndrome with constipation: Secondary | ICD-10-CM

## 2023-02-11 MED ORDER — TRAZODONE HCL 50 MG PO TABS: 25.0000 mg | ORAL_TABLET | Freq: Every evening | ORAL | 3 refills | Status: DC | PRN

## 2023-02-11 MED ORDER — LINACLOTIDE 72 MCG PO CAPS: 72.0000 ug | ORAL_CAPSULE | Freq: Every day | ORAL | 5 refills | Status: DC | PRN

## 2023-02-11 NOTE — Progress Notes (Signed)
[facility] [address] [place], [place] [address]  Internal MEDICINE  Office Visit Note  Patient Name: Roger Mclaughlin  [id number]  [id number]  Date of Service: 02/11/2023  Chief Complaint  Patient presents with   Acute Visit    Difficulty sleeping 3-4 weeks.      HPI Roger Mclaughlin presents for an acute sick visit for __ --onset: trouble sleeping for 3-4 weeks.  Trouble falling asleep and staying asleep.  Tried melatonin and it did not help much.  Turn down A/C Has not limited screens.  Increased anxiety at night about work. Has a stressful job.  Gets groggy but is not falling asleep during the daytime.  Has not tried doxylamine yet.    Current Medication:  Outpatient Encounter Medications as of 02/11/2023  Medication Sig   atorvastatin (LIPITOR) 20 MG tablet TAKE 1 TABLET AT BEDTIME   esomeprazole (NEXIUM) 40 MG capsule TAKE 1 CAPSULE DAILY AT 12 NOON   gabapentin (NEURONTIN) 100 MG capsule Take by mouth.   levothyroxine (SYNTHROID) 50 MCG tablet TAKE 1 TABLET EVERY MORNING BEFORE BREAKFAST   Magnesium 500 MG TABS Take 2 tablets by mouth daily.   meloxicam (MOBIC) 15 MG tablet TAKE 1 TABLET(15 MG) BY MOUTH DAILY   polyethylene glycol powder (GLYCOLAX/MIRALAX) 17 GM/SCOOP powder Take 1 Container by mouth every evening.   traZODone (DESYREL) 50 MG tablet Take 0.5-1 tablets (25-50 mg total) by mouth at bedtime as needed for sleep.   [DISCONTINUED] linaclotide (LINZESS) 72 MCG capsule    linaclotide (LINZESS) 72 MCG capsule Take 1 capsule (72 mcg total) by mouth daily as needed (constipation).   [DISCONTINUED] linaclotide (LINZESS) 72 MCG capsule Take 1 capsule (72 mcg total) by mouth daily as needed (constipation).   No facility-administered encounter medications on file as of 02/11/2023.      Medical History: Past Medical History:  Diagnosis Date   Chronic sinusitis    Constipation    GERD (gastroesophageal reflux disease)    Hypercholesteremia     Hypothyroidism    Irritable bowel syndrome      Vital Signs: BP (!) 126/92   Pulse 98   Temp 98.2 F (36.8 C)   Resp 16   Ht 5' 7.5" (1.715 m)   Wt 167 lb 9.6 oz (76 kg)   SpO2 99%   BMI 25.86 kg/m    Review of Systems  Constitutional: Negative.  Negative for appetite change, fatigue and fever.  Respiratory:  Negative for cough, chest tightness, shortness of breath and wheezing.   Cardiovascular: Negative.  Negative for chest pain and palpitations.  Endocrine: Positive for polyuria.  Genitourinary:  Positive for difficulty urinating, dysuria, frequency and urgency. Negative for enuresis, flank pain, genital sores, hematuria, penile discharge, penile pain, penile swelling, scrotal swelling and testicular pain.       Nocturia, weak urine stream, split urine stream.  Musculoskeletal: Negative.  Negative for arthralgias, back pain, gait problem, myalgias and neck stiffness.  Psychiatric/Behavioral:  Positive for sleep disturbance. Negative for self-injury and suicidal ideas.     Physical Exam Vitals reviewed.  Constitutional:      General: He is not in acute distress.    Appearance: Normal appearance. He is not ill-appearing.  HENT:     Head: Normocephalic and atraumatic.  Eyes:     Pupils: Pupils are equal, round, and reactive to light.  Cardiovascular:     Rate and Rhythm: Normal rate and regular rhythm.  Pulmonary:     Effort: Pulmonary effort  is normal. No respiratory distress.  Neurological:     Mental Status: He is alert and oriented to person, place, and time.  Psychiatric:        Mood and Affect: Mood normal.        Behavior: Behavior normal.       Assessment/Plan: 1. Mixed insomnia Try doxylamine OTC first but if this does not help may try trazodone as prescribed.  - traZODone (DESYREL) 50 MG tablet; Take 0.5-1 tablets (25-50 mg total) by mouth at bedtime as needed for sleep.  Dispense: 30 tablet; Refill: 3  2. Irritable bowel syndrome with  constipation Refills of linzess ordered, take as prescribed.  - linaclotide (LINZESS) 72 MCG capsule; Take 1 capsule (72 mcg total) by mouth daily as needed (constipation).  Dispense: 30 capsule; Refill: 5  3. Encounter for medication review Medication list reviewed and updated.  - gabapentin (NEURONTIN) 100 MG capsule; Take by mouth.   General Counseling: kap miltimore understanding of the findings of todays visit and agrees with plan of treatment. I have discussed any further diagnostic evaluation that may be needed or ordered today. We also reviewed his medications today. he has been encouraged to call the office with any questions or concerns that should arise related to todays visit.    Counseling:    No orders of the defined types were placed in this encounter.   Meds ordered this encounter  Medications   traZODone (DESYREL) 50 MG tablet    Sig: Take 0.5-1 tablets (25-50 mg total) by mouth at bedtime as needed for sleep.    Dispense:  30 tablet    Refill:  3   DISCONTD: linaclotide (LINZESS) 72 MCG capsule    Sig: Take 1 capsule (72 mcg total) by mouth daily as needed (constipation).    Dispense:  30 capsule    Refill:  5   linaclotide (LINZESS) 72 MCG capsule    Sig: Take 1 capsule (72 mcg total) by mouth daily as needed (constipation).    Dispense:  30 capsule    Refill:  5    Return if symptoms worsen or fail to improve.  [place] Controlled Substance Database was reviewed by me for overdose risk score (ORS)  Time spent:30 Minutes Time spent with patient included reviewing progress notes, labs, imaging studies, and discussing plan for follow up.   This patient was seen by Sallyanne Kuster, FNP-C in collaboration with Dr. Beverely Risen as a part of collaborative care agreement.  Earnie Rockhold R. Tedd Sias, MSN, FNP-C Internal Medicine

## 2023-02-11 NOTE — Patient Instructions (Signed)
Try over the counter sleep aid with doxylamine 25 mg first.

## 2023-02-20 ENCOUNTER — Other Ambulatory Visit: Payer: Self-pay | Admitting: Nurse Practitioner

## 2023-02-20 DIAGNOSIS — E782 Mixed hyperlipidemia: Secondary | ICD-10-CM

## 2023-04-14 ENCOUNTER — Telehealth: Payer: Self-pay

## 2023-04-14 ENCOUNTER — Other Ambulatory Visit: Payer: Self-pay

## 2023-04-14 DIAGNOSIS — K581 Irritable bowel syndrome with constipation: Secondary | ICD-10-CM

## 2023-04-14 DIAGNOSIS — G4700 Insomnia, unspecified: Secondary | ICD-10-CM

## 2023-04-14 MED ORDER — LINACLOTIDE 72 MCG PO CAPS: 72.0000 ug | ORAL_CAPSULE | Freq: Every day | ORAL | 1 refills | Status: DC | PRN

## 2023-04-14 MED ORDER — TRAZODONE HCL 50 MG PO TABS: 25.0000 mg | ORAL_TABLET | Freq: Every evening | ORAL | 0 refills | Status: DC | PRN

## 2023-04-17 ENCOUNTER — Telehealth: Payer: Self-pay | Admitting: Nurse Practitioner

## 2023-04-17 ENCOUNTER — Other Ambulatory Visit: Payer: Self-pay | Admitting: Nurse Practitioner

## 2023-04-17 DIAGNOSIS — K581 Irritable bowel syndrome with constipation: Secondary | ICD-10-CM

## 2023-04-17 DIAGNOSIS — K219 Gastro-esophageal reflux disease without esophagitis: Secondary | ICD-10-CM

## 2023-04-17 NOTE — Telephone Encounter (Signed)
GI referral sent via Epic to [facility]. Notified patient. Gave telephone # [phone]

## 2023-04-18 NOTE — Telephone Encounter (Signed)
Pt notified by Sheralyn Boatman

## 2023-04-23 ENCOUNTER — Telehealth: Payer: Self-pay

## 2023-04-23 NOTE — Telephone Encounter (Signed)
Left message for patient to give office a call back.  

## 2023-04-23 NOTE — Telephone Encounter (Signed)
Patient called back and I let him know that he was approve Linzess.

## 2023-04-25 ENCOUNTER — Telehealth: Payer: Self-pay

## 2023-04-25 ENCOUNTER — Telehealth: Payer: Self-pay | Admitting: Nurse Practitioner

## 2023-04-25 NOTE — Telephone Encounter (Signed)
GI referral sent via [facility] to [facility]. Notified patient. Gave pt telephone # [phone] -Sheralyn Boatman

## 2023-04-29 ENCOUNTER — Telehealth: Payer: Self-pay | Admitting: Nurse Practitioner

## 2023-04-29 NOTE — Telephone Encounter (Signed)
GI appointment 06/02/2023 @ [facility]

## 2023-05-21 ENCOUNTER — Other Ambulatory Visit: Payer: Self-pay | Admitting: Nurse Practitioner

## 2023-05-21 DIAGNOSIS — Z760 Encounter for issue of repeat prescription: Secondary | ICD-10-CM

## 2023-06-04 ENCOUNTER — Other Ambulatory Visit: Payer: Self-pay | Admitting: Gastroenterology

## 2023-06-04 DIAGNOSIS — K581 Irritable bowel syndrome with constipation: Secondary | ICD-10-CM

## 2023-06-20 ENCOUNTER — Ambulatory Visit (INDEPENDENT_AMBULATORY_CARE_PROVIDER_SITE_OTHER): Admitting: Nurse Practitioner

## 2023-06-20 ENCOUNTER — Encounter: Payer: Self-pay | Admitting: Nurse Practitioner

## 2023-06-20 VITALS — BP 110/80 | HR 88 | Temp 98.1°F | Resp 16 | Ht 67.5 in | Wt 161.0 lb

## 2023-06-20 DIAGNOSIS — E039 Hypothyroidism, unspecified: Secondary | ICD-10-CM | POA: Diagnosis not present

## 2023-06-20 DIAGNOSIS — N1831 Chronic kidney disease, stage 3a: Secondary | ICD-10-CM

## 2023-06-20 DIAGNOSIS — K582 Mixed irritable bowel syndrome: Secondary | ICD-10-CM | POA: Diagnosis not present

## 2023-06-20 DIAGNOSIS — Z0001 Encounter for general adult medical examination with abnormal findings: Secondary | ICD-10-CM

## 2023-06-20 DIAGNOSIS — R7303 Prediabetes: Secondary | ICD-10-CM

## 2023-06-20 DIAGNOSIS — Z760 Encounter for issue of repeat prescription: Secondary | ICD-10-CM

## 2023-06-20 DIAGNOSIS — G4700 Insomnia, unspecified: Secondary | ICD-10-CM

## 2023-06-20 DIAGNOSIS — K581 Irritable bowel syndrome with constipation: Secondary | ICD-10-CM

## 2023-06-20 MED ORDER — LEVOTHYROXINE SODIUM 50 MCG PO TABS
ORAL_TABLET | ORAL | 3 refills | Status: DC
Start: 2023-06-20 — End: 2024-01-21

## 2023-06-20 MED ORDER — RIFAXIMIN 550 MG PO TABS
550.0000 mg | ORAL_TABLET | Freq: Three times a day (TID) | ORAL | 0 refills | Status: DC
Start: 2023-06-20 — End: 2023-08-12

## 2023-06-20 NOTE — Progress Notes (Signed)
[facility] [address] [place], [place] [address]  Internal MEDICINE  Office Visit Note  Patient Name: Roger Mclaughlin  [id number]  [id number]  Date of Service: 06/20/2023  Chief Complaint  Patient presents with   Gastroesophageal Reflux   Hyperlipidemia   Annual Exam    HPI Loui presents for an annual well visit and physical exam.  Well-appearing 62 y.o. male with GERD, mixed IBS, hypothyroidism, insomnia and high cholesterol.  Routine CRC screening: sees gastroenterology currently undergoing multiple tests Labs:  due for routine labs  New or worsening pain: none Seeing [facility] for IBS issues: Has had a defecogram, now scheduled for anorectal manometry, and a CT abd/pelvis    Current Medication: Outpatient Encounter Medications as of 06/20/2023  Medication Sig   atorvastatin (LIPITOR) 20 MG tablet TAKE 1 TABLET AT BEDTIME   esomeprazole (NEXIUM) 40 MG capsule TAKE 1 CAPSULE DAILY AT 12 NOON   linaclotide (LINZESS) 72 MCG capsule Take 1 capsule (72 mcg total) by mouth daily as needed (constipation).   Magnesium 500 MG TABS Take 2 tablets by mouth daily.   polyethylene glycol powder (GLYCOLAX/MIRALAX) 17 GM/SCOOP powder Take 1 Container by mouth every evening.   rifaximin (XIFAXAN) 550 MG TABS tablet Take 1 tablet (550 mg total) by mouth 3 (three) times daily.   traZODone (DESYREL) 50 MG tablet Take 0.5-1 tablets (25-50 mg total) by mouth at bedtime as needed for sleep.   [DISCONTINUED] gabapentin (NEURONTIN) 100 MG capsule Take by mouth.   [DISCONTINUED] levothyroxine (SYNTHROID) 50 MCG tablet TAKE 1 TABLET EVERY MORNING BEFORE BREAKFAST   [DISCONTINUED] meloxicam (MOBIC) 15 MG tablet TAKE 1 TABLET(15 MG) BY MOUTH DAILY   levothyroxine (SYNTHROID) 50 MCG tablet TAKE 1 TABLET EVERY MORNING BEFORE BREAKFAST   No facility-administered encounter medications on file as of 06/20/2023.    Surgical History: Past Surgical History:  Procedure Laterality Date    COLONOSCOPY     COLONOSCOPY WITH PROPOFOL N/A 01/23/2017   Procedure: COLONOSCOPY WITH PROPOFOL;  Surgeon: Wyline Mood, MD;  Location: [facility] ENDOSCOPY;  Service: Endoscopy;  Laterality: N/A;   COLONOSCOPY WITH PROPOFOL N/A 07/05/2022   Procedure: COLONOSCOPY WITH PROPOFOL;  Surgeon: Regis Bill, MD;  Location: [facility] ENDOSCOPY;  Service: Endoscopy;  Laterality: N/A;  Please leave as a late morning appointment due to patient's transporation.   ESOPHAGOGASTRODUODENOSCOPY     WISDOM TOOTH EXTRACTION      Medical History: Past Medical History:  Diagnosis Date   Chronic sinusitis    Constipation    GERD (gastroesophageal reflux disease)    Hypercholesteremia    Hypothyroidism    Irritable bowel syndrome     Family History: Family History  Problem Relation Age of Onset   Irritable bowel syndrome Mother    Hypertension Mother    Diabetes Mother    Prostate cancer Father    Hypertension Father    Diabetes Father    Irritable bowel syndrome Sister     Social History   Socioeconomic History   Marital status: Single    Spouse name: Not on file   Number of children: Not on file   Years of education: Not on file   Highest education level: Not on file  Occupational History   Not on file  Tobacco Use   Smoking status: Never   Smokeless tobacco: Never  Vaping Use   Vaping status: Never Used  Substance and Sexual Activity   Alcohol use: Yes    Alcohol/week: 7.0 standard drinks of  alcohol    Types: 7 Glasses of wine per week    Comment: 1 glass of wine a day   Drug use: No   Sexual activity: Yes    Birth control/protection: Condom  Other Topics Concern   Not on file  Social History Narrative   Not on file   Social Determinants of Health   Financial Resource Strain: Not on file  Food Insecurity: Not on file  Transportation Needs: Not on file  Physical Activity: Not on file  Stress: Not on file  Social Connections: Not on file  Intimate Partner Violence: Not on  file      Review of Systems  Constitutional:  Negative for activity change, appetite change, chills, fatigue, fever and unexpected weight change.  HENT: Negative.  Negative for congestion, ear pain, rhinorrhea, sore throat and trouble swallowing.   Eyes: Negative.   Respiratory: Negative.  Negative for cough, chest tightness, shortness of breath and wheezing.   Cardiovascular: Negative.  Negative for chest pain.  Gastrointestinal: Negative.  Negative for abdominal pain, blood in stool, constipation, diarrhea, nausea and vomiting.  Endocrine: Negative.   Genitourinary: Negative.  Negative for difficulty urinating, dysuria, frequency, hematuria and urgency.  Musculoskeletal: Negative.  Negative for arthralgias, back pain, joint swelling, myalgias and neck pain.  Skin: Negative.  Negative for rash and wound.  Allergic/Immunologic: Negative.  Negative for immunocompromised state.  Neurological: Negative.  Negative for dizziness, seizures, numbness and headaches.  Hematological: Negative.   Psychiatric/Behavioral: Negative.  Negative for behavioral problems, self-injury and suicidal ideas. The patient is not nervous/anxious.     Vital Signs: BP 110/80   Pulse 88   Temp 98.1 F (36.7 C)   Resp 16   Ht 5' 7.5" (1.715 m)   Wt 161 lb (73 kg)   SpO2 99%   BMI 24.84 kg/m    Physical Exam Vitals reviewed.  Constitutional:      General: He is awake. He is not in acute distress.    Appearance: Normal appearance. He is well-developed, well-groomed and overweight. He is not ill-appearing.  HENT:     Head: Normocephalic and atraumatic.     Right Ear: Tympanic membrane, ear canal and external ear normal.     Left Ear: Tympanic membrane, ear canal and external ear normal.     Nose: Nose normal. No congestion or rhinorrhea.     Mouth/Throat:     Mouth: Mucous membranes are moist.     Pharynx: Oropharynx is clear. No oropharyngeal exudate or posterior oropharyngeal erythema.  Eyes:      General: Lids are normal. Vision grossly intact. Gaze aligned appropriately.     Extraocular Movements: Extraocular movements intact.     Conjunctiva/sclera: Conjunctivae normal.     Pupils: Pupils are equal, round, and reactive to light.     Funduscopic exam:    Right eye: Red reflex present.        Left eye: Red reflex present. Neck:     Vascular: No carotid bruit.  Cardiovascular:     Rate and Rhythm: Normal rate and regular rhythm.     Pulses: Normal pulses.     Heart sounds: Normal heart sounds. No murmur heard. Pulmonary:     Effort: Pulmonary effort is normal. No respiratory distress.     Breath sounds: Normal breath sounds. No wheezing.  Abdominal:     General: Bowel sounds are normal.     Palpations: Abdomen is soft.  Musculoskeletal:  General: Normal range of motion.     Cervical back: Normal range of motion and neck supple.  Lymphadenopathy:     Cervical: No cervical adenopathy.  Skin:    General: Skin is warm and dry.     Capillary Refill: Capillary refill takes less than 2 seconds.  Neurological:     Mental Status: He is alert and oriented to person, place, and time.  Psychiatric:        Mood and Affect: Mood normal.        Behavior: Behavior normal. Behavior is cooperative.        Thought Content: Thought content normal.        Judgment: Judgment normal.        Assessment/Plan: 1. Encounter for routine adult health examination with abnormal findings Age-appropriate preventive screenings and vaccinations discussed, annual physical exam completed. Routine labs for health maintenance will be ordered. PHM updated.   2. Irritable bowel syndrome with both constipation and diarrhea Will try xifaxan, take as prescribed. Continue to follow up with [facility] and complete scheduled procedures.  - rifaximin (XIFAXAN) 550 MG TABS tablet; Take 1 tablet (550 mg total) by mouth 3 (three) times daily.  Dispense: 42 tablet; Refill: 0  3. Acquired  hypothyroidism Continue levothyroxine as prescribed. Repeat thyroid labs  - levothyroxine (SYNTHROID) 50 MCG tablet; TAKE 1 TABLET EVERY MORNING BEFORE BREAKFAST  Dispense: 90 tablet; Refill: 3  4. Prediabetes Will repeat A1c. Continue diet and lifestyle recommendations  5. Stage 3a chronic kidney disease ([facility]) Will repeat metabolic panel  6. Mixed insomnia Continue trazodone as prescribed.     General Counseling: rodger malick understanding of the findings of todays visit and agrees with plan of treatment. I have discussed any further diagnostic evaluation that may be needed or ordered today. We also reviewed his medications today. he has been encouraged to call the office with any questions or concerns that should arise related to todays visit.    No orders of the defined types were placed in this encounter.   Meds ordered this encounter  Medications   rifaximin (XIFAXAN) 550 MG TABS tablet    Sig: Take 1 tablet (550 mg total) by mouth 3 (three) times daily.    Dispense:  42 tablet    Refill:  0   levothyroxine (SYNTHROID) 50 MCG tablet    Sig: TAKE 1 TABLET EVERY MORNING BEFORE BREAKFAST    Dispense:  90 tablet    Refill:  3    Return in about 1 year (around 06/19/2024) for CPE, Tahsin Benyo PCP and otherwise as needed .   Total time spent:30 Minutes Time spent includes review of chart, medications, test results, and follow up plan with the patient.   [place] Controlled Substance Database was reviewed by me.  This patient was seen by Sallyanne Kuster, FNP-C in collaboration with Dr. Beverely Risen as a part of collaborative care agreement.  Marzell Allemand R. Tedd Sias, MSN, FNP-C Internal medicine

## 2023-06-21 ENCOUNTER — Encounter: Payer: Self-pay | Admitting: Nurse Practitioner

## 2023-06-23 NOTE — Telephone Encounter (Signed)
Patient has been seen in office

## 2023-06-24 ENCOUNTER — Ambulatory Visit
Admission: RE | Admit: 2023-06-24 | Discharge: 2023-06-24 | Disposition: A | Discharge status: Home / Self Care | Source: Ambulatory Visit | Attending: Gastroenterology | Admitting: Gastroenterology

## 2023-06-24 DIAGNOSIS — K581 Irritable bowel syndrome with constipation: Secondary | ICD-10-CM

## 2023-06-24 MED ORDER — IOHEXOL 300 MG/ML  SOLN
100.0000 mL | Freq: Once | INTRAMUSCULAR | Status: AC | PRN
  Administered 2023-06-24: 100 mL via INTRAVENOUS

## 2023-07-02 ENCOUNTER — Other Ambulatory Visit: Payer: Self-pay | Admitting: Nurse Practitioner

## 2023-07-02 DIAGNOSIS — G4700 Insomnia, unspecified: Secondary | ICD-10-CM

## 2023-07-04 ENCOUNTER — Other Ambulatory Visit: Payer: Self-pay | Admitting: Nurse Practitioner

## 2023-07-04 DIAGNOSIS — E039 Hypothyroidism, unspecified: Secondary | ICD-10-CM

## 2023-07-05 LAB — COMPREHENSIVE METABOLIC PANEL
ALT: 34 [IU]/L (ref 0–44)
AST: 26 [IU]/L (ref 0–40)
Albumin: 4.4 g/dL (ref 3.9–4.9)
Alkaline Phosphatase: 89 [IU]/L (ref 44–121)
BUN/Creatinine Ratio: 13 (ref 10–24)
BUN: 15 mg/dL (ref 8–27)
Bilirubin Total: 0.5 mg/dL (ref 0.0–1.2)
CO2: 22 mmol/L (ref 20–29)
Calcium: 9.6 mg/dL (ref 8.6–10.2)
Chloride: 101 mmol/L (ref 96–106)
Creatinine, Ser: 1.2 mg/dL (ref 0.76–1.27)
Globulin, Total: 2.5 g/dL (ref 1.5–4.5)
Glucose: 82 mg/dL (ref 70–99)
Potassium: 4.4 mmol/L (ref 3.5–5.2)
Sodium: 138 mmol/L (ref 134–144)
Total Protein: 6.9 g/dL (ref 6.0–8.5)
eGFR: 69 mL/min/{1.73_m2} (ref >59)

## 2023-07-05 LAB — LIPID PANEL
Chol/HDL Ratio: 2.6 {ratio} (ref 0.0–5.0)
Cholesterol, Total: 222 mg/dL — ABNORMAL HIGH (ref 100–199)
HDL: 86 mg/dL (ref >39)
LDL Chol Calc (NIH): 123 mg/dL — ABNORMAL HIGH (ref 0–99)
Triglycerides: 73 mg/dL (ref 0–149)
VLDL Cholesterol Cal: 13 mg/dL (ref 5–40)

## 2023-07-05 LAB — CBC WITH DIFFERENTIAL/PLATELET
Basophils Absolute: 0 10*3/uL (ref 0.0–0.2)
Basos: 1 %
EOS (ABSOLUTE): 0.1 10*3/uL (ref 0.0–0.4)
Eos: 2 %
Hematocrit: 43.3 % (ref 37.5–51.0)
Hemoglobin: 14.5 g/dL (ref 13.0–17.7)
Immature Grans (Abs): 0 10*3/uL (ref 0.0–0.1)
Immature Granulocytes: 1 %
Lymphocytes Absolute: 1.7 10*3/uL (ref 0.7–3.1)
Lymphs: 50 %
MCH: 29.5 pg (ref 26.6–33.0)
MCHC: 33.5 g/dL (ref 31.5–35.7)
MCV: 88 fL (ref 79–97)
Monocytes Absolute: 0.3 10*3/uL (ref 0.1–0.9)
Monocytes: 8 %
Neutrophils Absolute: 1.3 10*3/uL — ABNORMAL LOW (ref 1.4–7.0)
Neutrophils: 38 %
Platelets: 187 10*3/uL (ref 150–450)
RBC: 4.92 x10E6/uL (ref 4.14–5.80)
RDW: 12.1 % (ref 11.6–15.4)
WBC: 3.4 10*3/uL (ref 3.4–10.8)

## 2023-07-05 LAB — T4, FREE: Free T4: 1.23 ng/dL (ref 0.82–1.77)

## 2023-07-05 LAB — HGB A1C W/O EAG: Hgb A1c MFr Bld: 5.9 % — ABNORMAL HIGH (ref 4.8–5.6)

## 2023-07-05 LAB — TSH: TSH: 0.986 u[IU]/mL (ref 0.450–4.500)

## 2023-08-08 ENCOUNTER — Other Ambulatory Visit: Payer: Self-pay

## 2023-08-08 DIAGNOSIS — E782 Mixed hyperlipidemia: Secondary | ICD-10-CM

## 2023-08-08 NOTE — Progress Notes (Signed)
He can go ahead and double his Lipitor to be taken at the asame time, please send a new RX for Lipitor 40 mg # 90 2 refills, okay to keep his next app

## 2023-08-12 ENCOUNTER — Other Ambulatory Visit: Payer: Self-pay

## 2023-08-12 DIAGNOSIS — K582 Mixed irritable bowel syndrome: Secondary | ICD-10-CM

## 2023-08-12 MED ORDER — RIFAXIMIN 550 MG PO TABS: 550.0000 mg | ORAL_TABLET | Freq: Three times a day (TID) | ORAL | 0 refills | Status: DC

## 2023-08-13 ENCOUNTER — Telehealth: Payer: Self-pay

## 2023-08-13 NOTE — Telephone Encounter (Signed)
Completed P.A. for patient's Xifaxan.

## 2023-08-19 ENCOUNTER — Other Ambulatory Visit: Payer: Self-pay

## 2023-08-19 MED ORDER — ATORVASTATIN CALCIUM 40 MG PO TABS: 40.0000 mg | ORAL_TABLET | Freq: Every day | ORAL | 2 refills | Status: DC

## 2023-08-20 ENCOUNTER — Telehealth: Payer: Self-pay

## 2023-08-20 NOTE — Telephone Encounter (Signed)
Patient notified

## 2023-08-20 NOTE — Telephone Encounter (Signed)
-----   Message from [facility] sent at 08/08/2023 10:40 AM EST ----- He can go ahead and double his Lipitor to be taken at the asame time, please send a new RX for Lipitor 40 mg # 90 2 refills, okay to keep his next app

## 2023-09-10 ENCOUNTER — Other Ambulatory Visit: Payer: Self-pay | Admitting: Nurse Practitioner

## 2023-09-10 DIAGNOSIS — K581 Irritable bowel syndrome with constipation: Secondary | ICD-10-CM

## 2023-10-13 ENCOUNTER — Ambulatory Visit: Admitting: Nurse Practitioner

## 2023-10-13 ENCOUNTER — Encounter: Payer: Self-pay | Admitting: Nurse Practitioner

## 2023-10-13 VITALS — BP 134/82 | HR 78 | Temp 97.6°F | Resp 16 | Ht 67.5 in | Wt 161.2 lb

## 2023-10-13 DIAGNOSIS — G4700 Insomnia, unspecified: Secondary | ICD-10-CM

## 2023-10-13 DIAGNOSIS — K219 Gastro-esophageal reflux disease without esophagitis: Secondary | ICD-10-CM | POA: Diagnosis not present

## 2023-10-13 DIAGNOSIS — K5909 Other constipation: Secondary | ICD-10-CM

## 2023-10-13 DIAGNOSIS — K581 Irritable bowel syndrome with constipation: Secondary | ICD-10-CM | POA: Diagnosis not present

## 2023-10-13 MED ORDER — TRAZODONE HCL 50 MG PO TABS
ORAL_TABLET | ORAL | 1 refills | Status: DC
Start: 2023-10-13 — End: 2023-11-10

## 2023-10-13 NOTE — Progress Notes (Signed)
[facility] [address] [place], [place] [address]  Internal MEDICINE  Office Visit Note  Patient Name: Roger Mclaughlin  [id number]  [id number]  Date of Service: 10/13/2023  Chief Complaint  Patient presents with   Gastroesophageal Reflux   Hyperlipidemia   Follow-up    HPI Roger Mclaughlin presents for a follow-up visit for IBS, insomnia, GERD, constipation and plantar fasciitis.  Chronic constipation --has been taking stool softener, miralax, linzess and still having problem, requesting second opinion from different GI specialist Insomnia -- still taking trazodone almost every night.  IBS -- was never able to get xifaxan approved. Does not need to take metronidazole.  GERD -- taking nexium which is controlling his symptoms  Bilateral plantar fasciitis --using custom orthotics now from podiatrist which is helping.      Current Medication: Outpatient Encounter Medications as of 10/13/2023  Medication Sig   atorvastatin (LIPITOR) 40 MG tablet Take 1 tablet (40 mg total) by mouth daily.   esomeprazole (NEXIUM) 40 MG capsule TAKE 1 CAPSULE DAILY AT 12 NOON   levothyroxine (SYNTHROID) 50 MCG tablet TAKE 1 TABLET EVERY MORNING BEFORE BREAKFAST   LINZESS 72 MCG capsule TAKE 1 CAPSULE DAILY AS NEEDED FOR CONSTIPATION   Magnesium 500 MG TABS Take 2 tablets by mouth daily.   polyethylene glycol powder (GLYCOLAX/MIRALAX) 17 GM/SCOOP powder Take 1 Container by mouth every evening.   rifaximin (XIFAXAN) 550 MG TABS tablet Take 1 tablet (550 mg total) by mouth 3 (three) times daily.   [DISCONTINUED] traZODone (DESYREL) 50 MG tablet TAKE ONE-HALF (1/2) TO ONE TABLET AT BEDTIME AS NEEDED FOR SLEEP   traZODone (DESYREL) 50 MG tablet TAKE ONE-HALF (1/2) TO ONE TABLET AT BEDTIME AS NEEDED FOR SLEEP   No facility-administered encounter medications on file as of 10/13/2023.    Surgical History: Past Surgical History:  Procedure Laterality Date   COLONOSCOPY     COLONOSCOPY WITH PROPOFOL  N/A 01/23/2017   Procedure: COLONOSCOPY WITH PROPOFOL;  Surgeon: Wyline Mood, MD;  Location: [facility] ENDOSCOPY;  Service: Endoscopy;  Laterality: N/A;   COLONOSCOPY WITH PROPOFOL N/A 07/05/2022   Procedure: COLONOSCOPY WITH PROPOFOL;  Surgeon: Regis Bill, MD;  Location: [facility] ENDOSCOPY;  Service: Endoscopy;  Laterality: N/A;  Please leave as a late morning appointment due to patient's transporation.   ESOPHAGOGASTRODUODENOSCOPY     WISDOM TOOTH EXTRACTION      Medical History: Past Medical History:  Diagnosis Date   Chronic sinusitis    Constipation    GERD (gastroesophageal reflux disease)    Hypercholesteremia    Hypothyroidism    Irritable bowel syndrome     Family History: Family History  Problem Relation Age of Onset   Irritable bowel syndrome Mother    Hypertension Mother    Diabetes Mother    Prostate cancer Father    Hypertension Father    Diabetes Father    Irritable bowel syndrome Sister     Social History   Socioeconomic History   Marital status: Single    Spouse name: Not on file   Number of children: Not on file   Years of education: Not on file   Highest education level: Not on file  Occupational History   Not on file  Tobacco Use   Smoking status: Never   Smokeless tobacco: Never  Vaping Use   Vaping status: Never Used  Substance and Sexual Activity   Alcohol use: Yes    Alcohol/week: 7.0 standard drinks of alcohol    Types: 7  Glasses of wine per week    Comment: 1 glass of wine a day   Drug use: No   Sexual activity: Yes    Birth control/protection: Condom  Other Topics Concern   Not on file  Social History Narrative   Not on file   Social Drivers of Health   Financial Resource Strain: Not on file  Food Insecurity: Not on file  Transportation Needs: Not on file  Physical Activity: Not on file  Stress: Not on file  Social Connections: Not on file  Intimate Partner Violence: Not on file      Review of Systems  Constitutional:  Negative.  Negative for appetite change, fatigue and fever.  Respiratory:  Negative for cough, chest tightness, shortness of breath and wheezing.   Cardiovascular: Negative.  Negative for chest pain and palpitations.  Gastrointestinal:  Positive for abdominal distention, abdominal pain and constipation.  Endocrine: Positive for polyuria.  Genitourinary: Negative.  Negative for difficulty urinating, dysuria, enuresis, flank pain, frequency, genital sores, hematuria, penile discharge, penile pain, penile swelling, scrotal swelling, testicular pain and urgency.  Musculoskeletal: Negative.  Negative for arthralgias, back pain, gait problem, myalgias and neck stiffness.  Psychiatric/Behavioral:  Positive for sleep disturbance. Negative for self-injury and suicidal ideas.     Vital Signs: BP 134/82   Pulse 78   Temp 97.6 F (36.4 C)   Resp 16   Ht 5' 7.5" (1.715 m)   Wt 161 lb 3.2 oz (73.1 kg)   SpO2 97%   BMI 24.87 kg/m    Physical Exam Vitals reviewed.  Constitutional:      General: He is not in acute distress.    Appearance: Normal appearance. He is normal weight. He is not ill-appearing.  HENT:     Head: Normocephalic and atraumatic.  Eyes:     Pupils: Pupils are equal, round, and reactive to light.  Cardiovascular:     Rate and Rhythm: Normal rate and regular rhythm.  Pulmonary:     Effort: Pulmonary effort is normal. No respiratory distress.  Neurological:     Mental Status: He is alert and oriented to person, place, and time.  Psychiatric:        Mood and Affect: Mood normal.        Behavior: Behavior normal.        Assessment/Plan: 1. Irritable bowel syndrome with constipation (Primary) Xifaxan samples provided to the patient for 2 weeks.  Referred to [facility] for second opinion  - Ambulatory referral to Gastroenterology  2. Gastroesophageal reflux disease without esophagitis Referred to [facility] for second opinion  - Ambulatory referral to  Gastroenterology  3. Mixed insomnia Continue trazodone as prescribed.  - traZODone (DESYREL) 50 MG tablet; TAKE ONE-HALF (1/2) TO ONE TABLET AT BEDTIME AS NEEDED FOR SLEEP  Dispense: 90 tablet; Refill: 1  4. Constipation, chronic Referred to [facility] for 2nd opinion - Ambulatory referral to Gastroenterology   General Counseling: mayjor ager understanding of the findings of todays visit and agrees with plan of treatment. I have discussed any further diagnostic evaluation that may be needed or ordered today. We also reviewed his medications today. he has been encouraged to call the office with any questions or concerns that should arise related to todays visit.    Orders Placed This Encounter  Procedures   Ambulatory referral to Gastroenterology    Meds ordered this encounter  Medications   traZODone (DESYREL) 50 MG tablet    Sig: TAKE ONE-HALF (1/2) TO ONE  TABLET AT BEDTIME AS NEEDED FOR SLEEP    Dispense:  90 tablet    Refill:  1    Return in about 3 months (around 01/11/2024) for F/U, Soua Lenk PCP.   Total time spent:30 Minutes Time spent includes review of chart, medications, test results, and follow up plan with the patient.    Controlled Substance Database was reviewed by me.  This patient was seen by Sallyanne Kuster, FNP-C in collaboration with Dr. Beverely Risen as a part of collaborative care agreement.   Daniell Paradise R. Tedd Sias, MSN, FNP-C Internal medicine

## 2023-11-10 ENCOUNTER — Other Ambulatory Visit: Payer: Self-pay

## 2023-11-10 DIAGNOSIS — G4700 Insomnia, unspecified: Secondary | ICD-10-CM

## 2023-11-10 MED ORDER — TRAZODONE HCL 50 MG PO TABS: ORAL_TABLET | ORAL | 1 refills | Status: DC

## 2023-11-10 MED ORDER — ATORVASTATIN CALCIUM 40 MG PO TABS: 40.0000 mg | ORAL_TABLET | Freq: Every day | ORAL | 2 refills | Status: DC

## 2023-12-11 ENCOUNTER — Telehealth: Payer: Self-pay | Admitting: Nurse Practitioner

## 2023-12-11 NOTE — Telephone Encounter (Signed)
 GI referral faxed to [facility] per patient request; [phone].

## 2024-01-12 ENCOUNTER — Ambulatory Visit (INDEPENDENT_AMBULATORY_CARE_PROVIDER_SITE_OTHER): Admitting: Nurse Practitioner

## 2024-01-12 ENCOUNTER — Encounter: Payer: Self-pay | Admitting: Nurse Practitioner

## 2024-01-12 VITALS — BP 120/76 | HR 82 | Temp 98.1°F | Resp 16 | Ht 67.5 in | Wt 160.6 lb

## 2024-01-12 DIAGNOSIS — K581 Irritable bowel syndrome with constipation: Secondary | ICD-10-CM | POA: Diagnosis not present

## 2024-01-12 DIAGNOSIS — E039 Hypothyroidism, unspecified: Secondary | ICD-10-CM

## 2024-01-12 DIAGNOSIS — K219 Gastro-esophageal reflux disease without esophagitis: Secondary | ICD-10-CM | POA: Diagnosis not present

## 2024-01-12 NOTE — Progress Notes (Signed)
 [facility] [address] [place], [place] [address]  Internal MEDICINE  Office Visit Note  Patient Name: Roger Mclaughlin  [id number]  [id number]  Date of Service: 01/12/2024  Chief Complaint  Patient presents with  . Gastroesophageal Reflux  . Hyperlipidemia  . Follow-up    HPI Roger Mclaughlin presents for a follow-up visit for thyroid issues, GERD and IBS. Thyroid issues -- has been off the thyroid medication for about 6 weeks. Years ago, a couple of nodules were found but he never had any abnormal thyroid labs per his records. IBS with constipation-- going to pelvic floor therapy. Wants to try seeing a different GI specialist. GERD -- takes esomeprazole  which helps some but does not control symptoms completely.     Current Medication: Outpatient Encounter Medications as of 01/12/2024  Medication Sig  . atorvastatin  (LIPITOR) 40 MG tablet Take 1 tablet (40 mg total) by mouth daily.  . esomeprazole  (NEXIUM ) 40 MG capsule TAKE 1 CAPSULE DAILY AT 12 NOON  . levothyroxine  (SYNTHROID ) 50 MCG tablet TAKE 1 TABLET EVERY MORNING BEFORE BREAKFAST  . LINZESS  72 MCG capsule TAKE 1 CAPSULE DAILY AS NEEDED FOR CONSTIPATION  . Magnesium  500 MG TABS Take 2 tablets by mouth daily.  . polyethylene glycol powder (GLYCOLAX/MIRALAX) 17 GM/SCOOP powder Take 1 Container by mouth every evening.  . traZODone  (DESYREL ) 50 MG tablet TAKE ONE-HALF (1/2) TO ONE TABLET AT BEDTIME AS NEEDED FOR SLEEP  . [DISCONTINUED] rifaximin  (XIFAXAN ) 550 MG TABS tablet Take 1 tablet (550 mg total) by mouth 3 (three) times daily.   No facility-administered encounter medications on file as of 01/12/2024.    Surgical History: Past Surgical History:  Procedure Laterality Date  . COLONOSCOPY    . COLONOSCOPY WITH PROPOFOL  N/A 01/23/2017   Procedure: COLONOSCOPY WITH PROPOFOL ;  Surgeon: Luke Salaam, MD;  Location: [facility] ENDOSCOPY;  Service: Endoscopy;  Laterality: N/A;  . COLONOSCOPY WITH PROPOFOL  N/A 07/05/2022    Procedure: COLONOSCOPY WITH PROPOFOL ;  Surgeon: Shane Darling, MD;  Location: [facility] ENDOSCOPY;  Service: Endoscopy;  Laterality: N/A;  Please leave as a late morning appointment due to patient's transporation.  . ESOPHAGOGASTRODUODENOSCOPY    . WISDOM TOOTH EXTRACTION      Medical History: Past Medical History:  Diagnosis Date  . Chronic sinusitis   . Constipation   . GERD (gastroesophageal reflux disease)   . Hypercholesteremia   . Hypothyroidism   . Irritable bowel syndrome     Family History: Family History  Problem Relation Age of Onset  . Irritable bowel syndrome Mother   . Hypertension Mother   . Diabetes Mother   . Prostate cancer Father   . Hypertension Father   . Diabetes Father   . Irritable bowel syndrome Sister     Social History   Socioeconomic History  . Marital status: Single    Spouse name: Not on file  . Number of children: Not on file  . Years of education: Not on file  . Highest education level: Not on file  Occupational History  . Not on file  Tobacco Use  . Smoking status: Never  . Smokeless tobacco: Never  Vaping Use  . Vaping status: Never Used  Substance and Sexual Activity  . Alcohol use: Yes    Alcohol/week: 7.0 standard drinks of alcohol    Types: 7 Glasses of wine per week    Comment: 1 glass of wine a day  . Drug use: No  . Sexual activity: Yes    Birth  control/protection: Condom  Other Topics Concern  . Not on file  Social History Narrative  . Not on file   Social Drivers of Health   Financial Resource Strain: Not on file  Food Insecurity: Not on file  Transportation Needs: Not on file  Physical Activity: Not on file  Stress: Not on file  Social Connections: Not on file  Intimate Partner Violence: Not on file      Review of Systems  Constitutional: Negative.  Negative for appetite change, fatigue and fever.  Respiratory:  Negative for cough, chest tightness, shortness of breath and wheezing.   Cardiovascular:  Negative.  Negative for chest pain and palpitations.  Gastrointestinal:  Positive for abdominal distention, abdominal pain and constipation.  Endocrine: Positive for polyuria.  Genitourinary: Negative.  Negative for difficulty urinating, dysuria, enuresis, flank pain, frequency, genital sores, hematuria, penile discharge, penile pain, penile swelling, scrotal swelling, testicular pain and urgency.  Musculoskeletal: Negative.  Negative for arthralgias, back pain, gait problem, myalgias and neck stiffness.  Psychiatric/Behavioral:  Positive for sleep disturbance. Negative for self-injury and suicidal ideas.     Vital Signs: BP 120/76   Pulse 82   Temp 98.1 F (36.7 C)   Resp 16   Ht 5' 7.5" (1.715 m)   Wt 160 lb 9.6 oz (72.8 kg)   SpO2 98%   BMI 24.78 kg/m    Physical Exam Vitals reviewed.  Constitutional:      General: He is not in acute distress.    Appearance: Normal appearance. He is normal weight. He is not ill-appearing.  HENT:     Head: Normocephalic and atraumatic.  Eyes:     Pupils: Pupils are equal, round, and reactive to light.  Cardiovascular:     Rate and Rhythm: Normal rate and regular rhythm.  Pulmonary:     Effort: Pulmonary effort is normal. No respiratory distress.  Neurological:     Mental Status: He is alert and oriented to person, place, and time.  Psychiatric:        Mood and Affect: Mood normal.        Behavior: Behavior normal.       Assessment/Plan: 1. Irritable bowel syndrome with constipation (Primary) *** - Ambulatory referral to Gastroenterology  2. Gastroesophageal reflux disease without esophagitis *** - Ambulatory referral to Gastroenterology  3. Acquired hypothyroidism *** - TSH + free T4   General Counseling: [name] verbalizes understanding of the findings of todays visit and agrees with plan of treatment. I have discussed any further diagnostic evaluation that may be needed or ordered today. We also reviewed his medications  today. he has been encouraged to call the office with any questions or concerns that should arise related to todays visit.    Orders Placed This Encounter  Procedures  . TSH + free T4  . Ambulatory referral to Gastroenterology    No orders of the defined types were placed in this encounter.   Return for previously scheduled, CPE, Blimie Vaness PCP in october, and otherwise as needed .   Total time spent:30 Minutes Time spent includes review of chart, medications, test results, and follow up plan with the patient.   [place] Controlled Substance Database was reviewed by me.  This patient was seen by Laurence Pons, FNP-C in collaboration with Dr. Verneta Gone as a part of collaborative care agreement.   Guida Asman R. Bobbi Burow, MSN, FNP-C Internal medicine

## 2024-01-13 ENCOUNTER — Encounter: Payer: Self-pay | Admitting: Nurse Practitioner

## 2024-01-16 LAB — TSH+FREE T4
Free T4: 0.94 ng/dL (ref 0.82–1.77)
TSH: 1.8 u[IU]/mL (ref 0.450–4.500)

## 2024-01-19 ENCOUNTER — Telehealth: Payer: Self-pay | Admitting: Nurse Practitioner

## 2024-01-19 NOTE — Telephone Encounter (Signed)
 GI referral faxed to [facility]; [phone] Notified patient. Gave pt telephone [phone]

## 2024-01-21 ENCOUNTER — Telehealth: Payer: Self-pay

## 2024-01-21 ENCOUNTER — Other Ambulatory Visit: Payer: Self-pay

## 2024-01-21 NOTE — Progress Notes (Signed)
 His thyroid lab is still normal. He can continue to stay off the levothyroxine . We can check the thyroid labs yearly or if there are any changes.

## 2024-01-21 NOTE — Telephone Encounter (Signed)
Pt notified for labs result  

## 2024-01-21 NOTE — Telephone Encounter (Signed)
-----   Message from [place] sent at 01/21/2024  8:00 AM EDT ----- His thyroid lab is still normal. He can continue to stay off the levothyroxine . We can check the thyroid labs yearly or if there are any changes.

## 2024-02-17 ENCOUNTER — Telehealth: Payer: Self-pay | Admitting: Nurse Practitioner

## 2024-02-17 NOTE — Telephone Encounter (Signed)
 Received VA benefit form from [facility], [place] to [place] to complete-Toni

## 2024-02-18 ENCOUNTER — Telehealth: Payer: Self-pay | Admitting: Nurse Practitioner

## 2024-02-18 NOTE — Telephone Encounter (Signed)
 VA benefit form & office notes faxed back to [facility]; [phone]. Scanned-Toni

## 2024-03-04 ENCOUNTER — Encounter: Payer: Self-pay | Admitting: Nurse Practitioner

## 2024-03-04 ENCOUNTER — Ambulatory Visit (INDEPENDENT_AMBULATORY_CARE_PROVIDER_SITE_OTHER): Admitting: Nurse Practitioner

## 2024-03-04 VITALS — BP 118/82 | HR 81 | Temp 98.3°F | Resp 16 | Ht 67.5 in | Wt 159.2 lb

## 2024-03-04 DIAGNOSIS — M722 Plantar fascial fibromatosis: Secondary | ICD-10-CM | POA: Diagnosis not present

## 2024-03-04 DIAGNOSIS — G588 Other specified mononeuropathies: Secondary | ICD-10-CM

## 2024-03-04 DIAGNOSIS — K581 Irritable bowel syndrome with constipation: Secondary | ICD-10-CM | POA: Diagnosis not present

## 2024-03-04 NOTE — Progress Notes (Unsigned)
 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   esomeprazole  (NEXIUM ) 40 MG capsule TAKE 1 CAPSULE DAILY AT 12 NOON   LINZESS  72 MCG capsule TAKE 1 CAPSULE DAILY AS NEEDED FOR CONSTIPATION   Magnesium  500 MG TABS Take 2 tablets by mouth daily.   polyethylene glycol powder (GLYCOLAX/MIRALAX) 17 GM/SCOOP powder Take 1 Container by mouth every evening.   traZODone  (DESYREL ) 50 MG tablet TAKE ONE-HALF (1/2) TO ONE TABLET AT BEDTIME AS NEEDED FOR SLEEP   No facility-administered encounter medications on file as of 03/04/2024.    Surgical History: Past Surgical History:  Procedure Laterality Date   COLONOSCOPY     COLONOSCOPY WITH PROPOFOL  N/A 01/23/2017   Procedure: COLONOSCOPY WITH PROPOFOL ;  Surgeon: Luke Salaam, MD;  Location: [facility] ENDOSCOPY;  Service: Endoscopy;  Laterality: N/A;   COLONOSCOPY WITH PROPOFOL  N/A 07/05/2022   Procedure: COLONOSCOPY WITH PROPOFOL ;  Surgeon: Shane Darling, MD;  Location: [facility] ENDOSCOPY;  Service: Endoscopy;  Laterality: N/A;  Please leave as a late morning appointment due to patient's transporation.   ESOPHAGOGASTRODUODENOSCOPY     WISDOM TOOTH EXTRACTION      Medical History: Past Medical History:  Diagnosis Date   Chronic sinusitis    Constipation    GERD (gastroesophageal reflux disease)    Hypercholesteremia    Hypothyroidism    Irritable bowel syndrome      Family History: Family History  Problem Relation Age of Onset   Irritable bowel syndrome Mother    Hypertension Mother    Diabetes Mother    Prostate cancer Father    Hypertension Father    Diabetes Father    Irritable bowel syndrome Sister     Social History   Socioeconomic History   Marital status: Single    Spouse name: Not on file   Number of children: Not on file   Years of education: Not on file   Highest education level: Not on file  Occupational History   Not on file  Tobacco Use   Smoking status: Never   Smokeless tobacco: Never  Vaping Use   Vaping status: Never Used  Substance and Sexual Activity   Alcohol use: Yes    Alcohol/week: 7.0 standard drinks of alcohol    Types: 7 Glasses of wine per week    Comment: 1 glass of wine a day   Drug use: No   Sexual activity: Yes    Birth control/protection: Condom  Other Topics Concern   Not on file  Social History Narrative   Not on file   Social Drivers of Health   Financial Resource Strain: Not on file  Food Insecurity: Not on file  Transportation Needs: Not on file  Physical Activity: Not on file  Stress: Not on file  Social Connections: Not on file  Intimate Partner Violence: Not on file      Review of Systems  Vital  Signs: BP 118/82   Pulse 81   Temp 98.3 F (36.8 C)   Resp 16   Ht 5' 7.5 (1.715 m)   Wt 159 lb 3.2 oz (72.2 kg)   SpO2 99%   BMI 24.57 kg/m    Physical Exam     Assessment/Plan:   General Counseling: Tripton verbalizes understanding of the findings of todays visit and agrees with plan of treatment. I have discussed any further diagnostic evaluation that may be needed or ordered today. We also reviewed his medications today. he has been encouraged to call the office with any questions or concerns that should arise related to todays visit.    Orders Placed This Encounter  Procedures   Ambulatory referral to Urology   Ambulatory referral to Podiatry    No  orders of the defined types were placed in this encounter.   No follow-ups on file.   Total time spent:*** Minutes Time spent includes review of chart, medications, test results, and follow up plan with the patient.   [place] Controlled Substance Database was reviewed by me.  This patient was seen by Laurence Pons, FNP-C in collaboration with Dr. Verneta Gone as a part of collaborative care agreement.   Jaelle Campanile R. Bobbi Burow, MSN, FNP-C Internal medicine

## 2024-03-05 ENCOUNTER — Encounter: Payer: Self-pay | Admitting: Nurse Practitioner

## 2024-03-11 ENCOUNTER — Ambulatory Visit (INDEPENDENT_AMBULATORY_CARE_PROVIDER_SITE_OTHER): Admitting: Podiatry

## 2024-03-11 ENCOUNTER — Encounter: Payer: Self-pay | Admitting: Podiatry

## 2024-03-11 DIAGNOSIS — M722 Plantar fascial fibromatosis: Secondary | ICD-10-CM | POA: Diagnosis not present

## 2024-03-11 MED ORDER — CELECOXIB 200 MG PO CAPS: 200.0000 mg | ORAL_CAPSULE | Freq: Two times a day (BID) | ORAL | 3 refills | Status: DC

## 2024-03-12 ENCOUNTER — Ambulatory Visit

## 2024-03-12 NOTE — Progress Notes (Signed)
 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  He states that he wore his orthotics for about 6 months and then they started to get too hard.  He said he noticed that when he changed shoes.  He states that the left foot seems to be worse still having that numbness which never just quite went away.  Objective: Vital signs are stable alert and oriented x 3.  Pulses are palpable.  He has some no real replication of the plantar fascial type symptoms that he has pain down deep in the bones he says.  Assessment: Heel pain cannot rule out plantar fasciitis and neuropathy.  Plan: Recommended he follow-up with Trish to discuss his orthotics and see if there is any way to make them any softer or work any better.  And we will send in Celebrex  200 mg 1 p.o. twice daily to [facility] on [address] in [place].  Follow-up with him as needed

## 2024-03-12 NOTE — Progress Notes (Signed)
 Inserts are too hard for patient cannot tolerate taking back to [place] to thin the arch making it more flexible  Patient will stop in to [place] office to pick up after 7/11

## 2024-03-24 ENCOUNTER — Other Ambulatory Visit: Payer: Self-pay | Admitting: Nurse Practitioner

## 2024-03-24 DIAGNOSIS — Z760 Encounter for issue of repeat prescription: Secondary | ICD-10-CM

## 2024-03-30 ENCOUNTER — Encounter: Payer: Self-pay | Admitting: Urology

## 2024-04-07 ENCOUNTER — Ambulatory Visit: Admitting: Urology

## 2024-04-28 ENCOUNTER — Ambulatory Visit (INDEPENDENT_AMBULATORY_CARE_PROVIDER_SITE_OTHER): Admitting: Urology

## 2024-04-28 VITALS — BP 127/83 | HR 78 | Ht 67.0 in | Wt 160.0 lb

## 2024-04-28 DIAGNOSIS — R399 Unspecified symptoms and signs involving the genitourinary system: Secondary | ICD-10-CM | POA: Diagnosis not present

## 2024-04-28 DIAGNOSIS — Z125 Encounter for screening for malignant neoplasm of prostate: Secondary | ICD-10-CM | POA: Diagnosis not present

## 2024-04-28 DIAGNOSIS — R102 Pelvic and perineal pain: Secondary | ICD-10-CM

## 2024-04-28 DIAGNOSIS — R1031 Right lower quadrant pain: Secondary | ICD-10-CM

## 2024-04-28 MED ORDER — AMITRIPTYLINE HCL 50 MG PO TABS
50.0000 mg | ORAL_TABLET | Freq: Every day | ORAL | 3 refills | Status: DC
Start: 2024-04-28 — End: 2024-07-06

## 2024-04-28 NOTE — Patient Instructions (Signed)
 SABRA

## 2024-04-28 NOTE — Progress Notes (Signed)
   04/28/2024 4:00 PM   Roger Mclaughlin [birth year] [id number]  Reason for visit: Pelvic pain, urinary symptoms, PSA screening  HPI: 63 year old male who I saw previously in February 2024 for urinary symptoms of urgency, weak stream, split stream.  He opted to start with avoiding bladder irritants and pelvic floor exercises, and symptoms have overall improved.  He never followed up.  He reports that over the last 9 months he has had right sided low back, right groin, and right leg pain, as well as some referred pain in the penis.  This is worse with sitting.  He is not tried any medications for this.  He also has a history of IBS, he follows with pelvic floor physical therapy for a diagnosis of pelvic floor dysfunction.  PSA has been normal, most recently 1.2.  We discussed possible causes of pelvic pain including neuropathic, pelvic floor dysfunction.  I do think he would benefit from an MRI based on his radiating pain to the groin and low back pain.  We discussed this would be managed and ordered by PCP or neurosurgery.  He previously has tried gabapentin for foot pain with no improvement in his groin or back pain, as well as Celebrex  with no improvement.  I encouraged him to continue to work with pelvic floor physical therapy and stretching exercises were provided today.  Offered a trial of amitriptyline  nightly.  We discussed that urology does not perform any nerve type surgeries, and I think he would be better evaluated with an MRI and possible referral to neurosurgery if any abnormal findings.  Trial of amitriptyline  50 mg nightly Continue to work with pelvic floor physical therapy, additional stretches/exercises provided Recommend lumbar MRI ordered by PCP and neurosurgery referral if abnormalities RTC 3 months symptom check   Roger JAYSON Burnet, MD  [facility] [address] [place], [place] [address] [phone]

## 2024-04-30 ENCOUNTER — Telehealth: Payer: Self-pay

## 2024-04-30 NOTE — Telephone Encounter (Signed)
 He is ok with that, so just order it and we can see if they approve of it.

## 2024-05-19 ENCOUNTER — Other Ambulatory Visit: Payer: Self-pay | Admitting: Nurse Practitioner

## 2024-05-19 ENCOUNTER — Telehealth: Payer: Self-pay

## 2024-05-19 DIAGNOSIS — R1031 Right lower quadrant pain: Secondary | ICD-10-CM

## 2024-05-19 DIAGNOSIS — R102 Pelvic and perineal pain: Secondary | ICD-10-CM

## 2024-05-19 NOTE — Progress Notes (Unsigned)
 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

## 2024-05-19 NOTE — Telephone Encounter (Signed)
 Pt advised that alyssa ordered MRI today andree is working on authorization once done she will call him

## 2024-05-20 ENCOUNTER — Encounter: Payer: Self-pay | Admitting: Nurse Practitioner

## 2024-05-20 ENCOUNTER — Ambulatory Visit (INDEPENDENT_AMBULATORY_CARE_PROVIDER_SITE_OTHER): Admitting: Nurse Practitioner

## 2024-05-20 VITALS — BP 128/76 | HR 83 | Temp 98.2°F | Resp 16 | Ht 67.0 in | Wt 160.8 lb

## 2024-05-20 DIAGNOSIS — G8929 Other chronic pain: Secondary | ICD-10-CM

## 2024-05-20 DIAGNOSIS — M546 Pain in thoracic spine: Secondary | ICD-10-CM

## 2024-05-20 DIAGNOSIS — R102 Pelvic and perineal pain: Secondary | ICD-10-CM | POA: Diagnosis not present

## 2024-05-20 DIAGNOSIS — M722 Plantar fascial fibromatosis: Secondary | ICD-10-CM

## 2024-05-20 DIAGNOSIS — M5441 Lumbago with sciatica, right side: Secondary | ICD-10-CM | POA: Diagnosis not present

## 2024-05-20 DIAGNOSIS — R1031 Right lower quadrant pain: Secondary | ICD-10-CM | POA: Diagnosis not present

## 2024-05-20 MED ORDER — PREGABALIN 25 MG PO CAPS: 25.0000 mg | ORAL_CAPSULE | Freq: Two times a day (BID) | ORAL | 1 refills | Status: DC

## 2024-05-20 NOTE — Progress Notes (Signed)
 [facility] [address] [place], [place] [address]  Internal MEDICINE  Office Visit Note  Patient Name: Roger Mclaughlin  [id number]  [id number]  Date of Service: 05/20/2024  Chief Complaint  Patient presents with   Acute Visit    Mri of back      HPI Roger Mclaughlin presents for an acute sick visit for chronic mid back pain, chronic low back pain and right groin pain. He does have right sided sciatica.  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 He has difficulty sitting up due to the groin pain but then also has pain in his back if he lays down supine. He reports significantly depressed mood due to this persistent pain and its impact on his day to day activities and quality of life.       Current Medication:  Outpatient Encounter Medications as of 05/20/2024  Medication Sig   pregabalin  (LYRICA ) 25 MG capsule Take 1 capsule (25 mg total) by mouth 2 (two) times daily.   amitriptyline  (ELAVIL ) 50 MG tablet Take 1 tablet (50 mg total) by mouth at bedtime.   atorvastatin  (LIPITOR) 40 MG tablet Take 1 tablet (40 mg total) by mouth daily.   celecoxib  (CELEBREX ) 200 MG capsule Take 1 capsule (200 mg total) by mouth 2 (two) times daily.   esomeprazole  (NEXIUM ) 40 MG capsule TAKE 1 CAPSULE DAILY AT 12 NOON   LINZESS  72 MCG capsule TAKE 1 CAPSULE DAILY AS NEEDED FOR CONSTIPATION   Magnesium  500 MG TABS Take 2 tablets by mouth daily.   polyethylene glycol powder (GLYCOLAX/MIRALAX) 17 GM/SCOOP powder Take 1 Container by mouth every evening.   traZODone  (DESYREL ) 50 MG tablet TAKE ONE-HALF (1/2) TO ONE TABLET AT BEDTIME AS NEEDED FOR SLEEP   No facility-administered encounter medications on file as  of 05/20/2024.      Medical History: Past Medical History:  Diagnosis Date   Chronic sinusitis    Constipation    GERD (gastroesophageal reflux disease)    Hypercholesteremia    Hypothyroidism    Irritable bowel syndrome      Vital Signs: BP 128/76   Pulse 83   Temp 98.2 F (36.8 C)   Resp 16   Ht 5' 7 (1.702 m)   Wt 160 lb 12.8 oz (72.9 kg)   SpO2 98%   BMI 25.18 kg/m    Review of Systems  Constitutional:  Positive for fatigue.  HENT: Negative.    Respiratory: Negative.  Negative for cough, chest tightness, shortness of breath and wheezing.   Cardiovascular: Negative.  Negative for chest pain and palpitations.  Gastrointestinal:  Positive for abdominal distention, abdominal pain and constipation.  Genitourinary:  Positive for difficulty urinating, dysuria and penile pain.       Groin pain  Musculoskeletal:  Positive for arthralgias, back pain, gait problem and myalgias (right thigh).  Neurological:  Positive for numbness (intermittent numbness and tingling in the groin area and bilateral thighs).  Psychiatric/Behavioral:  Positive for behavioral problems and sleep disturbance. Negative for self-injury and suicidal ideas. The patient is nervous/anxious.     Physical Exam Vitals reviewed.  Constitutional:      General: He is not in acute distress.  Appearance: Normal appearance. He is not ill-appearing.  HENT:     Head: Normocephalic and atraumatic.  Eyes:     Pupils: Pupils are equal, round, and reactive to light.  Cardiovascular:     Rate and Rhythm: Normal rate and regular rhythm.  Pulmonary:     Effort: Pulmonary effort is normal. No respiratory distress.  Musculoskeletal:     Thoracic back: Spasms and tenderness present. Decreased range of motion.     Lumbar back: Spasms and tenderness present. Decreased range of motion.       Legs:     Comments: Marked area shows where the low back pain started and that it travels down his right leg causing sciatica.   Skin:    General: Skin is warm and dry.     Capillary Refill: Capillary refill takes less than 2 seconds.  Neurological:     Mental Status: He is alert and oriented to person, place, and time.  Psychiatric:        Mood and Affect: Mood normal.        Behavior: Behavior normal.       Assessment/Plan: 1. Chronic midline low back pain with right-sided sciatica MRI of lumbar spine ordered  - MR Lumbar Spine Wo Contrast; Future  2. Chronic midline thoracic back pain (Primary) MRI of thoracic spine ordered  - MR Thoracic Spine Wo Contrast; Future  3. Right inguinal pain Pelvic MRI ordered, considering neurosurgery referral pending results   4. Pelvic pain in male Pelvic MRI ordered, considering neurosurgery referral pending results   5. Bilateral plantar fasciitis Sees podiatry   General Counseling: remer couse understanding of the findings of todays visit and agrees with plan of treatment. I have discussed any further diagnostic evaluation that may be needed or ordered today. We also reviewed his medications today. he has been encouraged to call the office with any questions or concerns that should arise related to todays visit.    Counseling:    Orders Placed This Encounter  Procedures   MR Lumbar Spine Wo Contrast   MR Thoracic Spine Wo Contrast    Meds ordered this encounter  Medications   pregabalin  (LYRICA ) 25 MG capsule    Sig: Take 1 capsule (25 mg total) by mouth 2 (two) times daily.    Dispense:  60 capsule    Refill:  1    Fill new script today    Return in about 3 weeks (around 06/10/2024) for F/U, Roger Mclaughlin PCP MRI results .  [place] Controlled Substance Database was reviewed by me for overdose risk score (ORS)  Time spent:30 Minutes Time spent with patient included reviewing progress notes, labs, imaging studies, and discussing plan for follow up.   This patient was seen by Mardy Maxin, FNP-C in collaboration with Dr. Sigrid Bathe as a part of  collaborative care agreement.  Gagan Dillion R. Maxin, MSN, FNP-C Internal Medicine

## 2024-05-24 ENCOUNTER — Encounter: Payer: Self-pay | Admitting: Nurse Practitioner

## 2024-05-25 ENCOUNTER — Other Ambulatory Visit

## 2024-05-27 ENCOUNTER — Ambulatory Visit
Admission: RE | Admit: 2024-05-27 | Discharge: 2024-05-27 | Disposition: A | Discharge status: Home / Self Care | Source: Ambulatory Visit | Attending: Nurse Practitioner | Admitting: Nurse Practitioner

## 2024-05-27 DIAGNOSIS — R1031 Right lower quadrant pain: Secondary | ICD-10-CM

## 2024-05-27 DIAGNOSIS — G8929 Other chronic pain: Secondary | ICD-10-CM

## 2024-05-27 DIAGNOSIS — R102 Pelvic and perineal pain: Secondary | ICD-10-CM

## 2024-05-27 MED ORDER — GADOPICLENOL 0.5 MMOL/ML IV SOLN
7.5000 mL | Freq: Once | INTRAVENOUS | Status: AC | PRN
Start: 2024-05-27 — End: 2024-05-27
  Administered 2024-05-27: 7.5 mL via INTRAVENOUS

## 2024-06-15 ENCOUNTER — Encounter: Payer: Self-pay | Admitting: Nurse Practitioner

## 2024-06-15 ENCOUNTER — Ambulatory Visit (INDEPENDENT_AMBULATORY_CARE_PROVIDER_SITE_OTHER): Admitting: Nurse Practitioner

## 2024-06-15 VITALS — BP 116/82 | HR 81 | Temp 98.3°F | Resp 16 | Ht 67.0 in | Wt 159.0 lb

## 2024-06-15 DIAGNOSIS — G8929 Other chronic pain: Secondary | ICD-10-CM

## 2024-06-15 DIAGNOSIS — M4716 Other spondylosis with myelopathy, lumbar region: Secondary | ICD-10-CM

## 2024-06-15 DIAGNOSIS — M47814 Spondylosis without myelopathy or radiculopathy, thoracic region: Secondary | ICD-10-CM

## 2024-06-15 DIAGNOSIS — M542 Cervicalgia: Secondary | ICD-10-CM | POA: Diagnosis not present

## 2024-06-15 DIAGNOSIS — M4807 Spinal stenosis, lumbosacral region: Secondary | ICD-10-CM

## 2024-06-15 DIAGNOSIS — K6289 Other specified diseases of anus and rectum: Secondary | ICD-10-CM

## 2024-06-15 DIAGNOSIS — K581 Irritable bowel syndrome with constipation: Secondary | ICD-10-CM

## 2024-06-15 MED ORDER — PREGABALIN 50 MG PO CAPS: 50.0000 mg | ORAL_CAPSULE | Freq: Two times a day (BID) | ORAL | 2 refills | Status: DC

## 2024-06-15 MED ORDER — LINACLOTIDE 72 MCG PO CAPS: ORAL_CAPSULE | ORAL | 3 refills | Status: DC

## 2024-06-15 NOTE — Progress Notes (Signed)
 [facility] [address] [place], [place] [address]  Internal MEDICINE  Office Visit Note  Patient Name: Roger Mclaughlin  [id number]  [id number]  Date of Service: 06/15/2024  Chief Complaint  Patient presents with   Gastroesophageal Reflux   Hyperlipidemia   Follow-up    Mri results    HPI Roger Mclaughlin presents for a follow-up visit for MRI results Reviewed MRI results with patient, results noted below: --Lumbar spondylosis and degenerative disc disease, causing moderate impingement at L5-S1. --Thoracic spondylosis and degenerative disc disease, but without observed impingement. --Cervical spondylosis and degenerative disc disease which could be assessed with diagnostic MRI of the cervical spine if clinically indicated. --Mucosal hyperenhancement and vascular combing of the rectum, suggesting nonspecific infectious or inflammatory proctitis. --Sigmoid diverticulosis without evidence of acute diverticulitis. --Prostatomegaly. --No MRI findings of the pelvis to explain right inguinal pain. No evidence of inguinal hernia.   Current Medication: Outpatient Encounter Medications as of 06/15/2024  Medication Sig   [DISCONTINUED] pregabalin  (LYRICA ) 50 MG capsule Take 1 capsule (50 mg total) by mouth 2 (two) times daily. (Patient not taking: Reported on 06/30/2024)   atorvastatin  (LIPITOR) 40 MG tablet Take 1 tablet (40 mg total) by mouth daily.   esomeprazole  (NEXIUM ) 40 MG capsule TAKE 1 CAPSULE DAILY AT 12 NOON   Magnesium  500 MG TABS Take 2 tablets by mouth daily.   polyethylene glycol powder (GLYCOLAX/MIRALAX) 17 GM/SCOOP powder Take 1 Container by mouth every evening.   traZODone  (DESYREL ) 50 MG tablet TAKE ONE-HALF (1/2) TO ONE TABLET AT BEDTIME AS NEEDED FOR SLEEP   [DISCONTINUED] amitriptyline  (ELAVIL ) 50 MG tablet Take 1 tablet (50 mg total) by mouth at bedtime. (Patient not taking: Reported on 06/30/2024)   [DISCONTINUED] celecoxib  (CELEBREX ) 200 MG capsule Take  1 capsule (200 mg total) by mouth 2 (two) times daily. (Patient not taking: Reported on 06/30/2024)   [DISCONTINUED] linaclotide  (LINZESS ) 72 MCG capsule TAKE 1 CAPSULE DAILY AS NEEDED FOR CONSTIPATION   [DISCONTINUED] LINZESS  72 MCG capsule TAKE 1 CAPSULE DAILY AS NEEDED FOR CONSTIPATION   [DISCONTINUED] pregabalin  (LYRICA ) 25 MG capsule Take 1 capsule (25 mg total) by mouth 2 (two) times daily.   No facility-administered encounter medications on file as of 06/15/2024.    Surgical History: Past Surgical History:  Procedure Laterality Date   COLONOSCOPY     COLONOSCOPY WITH PROPOFOL  N/A 01/23/2017   Procedure: COLONOSCOPY WITH PROPOFOL ;  Surgeon: Therisa Bi, MD;  Location[facility] ENDOSCOPY;  Service: Endoscopy;  Laterality: N/A;   COLONOSCOPY WITH PROPOFOL  N/A 07/05/2022   Procedure: COLONOSCOPY WITH PROPOFOL ;  Surgeon: Maryruth Ole DASEN, MD;  Location: [facility] ENDOSCOPY;  Service: Endoscopy;  Laterality: N/A;  Please leave as a late morning appointment due to patient's transporation.   ESOPHAGOGASTRODUODENOSCOPY     WISDOM TOOTH EXTRACTION      Medical History: Past Medical History:  Diagnosis Date   Chronic sinusitis    Constipation    GERD (gastroesophageal reflux disease)    Hypercholesteremia    Hypothyroidism    Irritable bowel syndrome     Family History: Family History  Problem Relation Age of Onset   Irritable bowel syndrome Mother    Hypertension Mother    Diabetes Mother    Prostate cancer Father    Hypertension Father    Diabetes Father    Irritable bowel syndrome Sister     Social History   Socioeconomic History   Marital status: Single    Spouse name: Not on file   Number  of children: Not on file   Years of education: Not on file   Highest education level: Not on file  Occupational History   Not on file  Tobacco Use   Smoking status: Never   Smokeless tobacco: Never  Vaping Use   Vaping status: Never Used  Substance and Sexual Activity   Alcohol  use: Yes    Alcohol/week: 7.0 standard drinks of alcohol    Types: 7 Glasses of wine per week    Comment: 1 glass of wine a day   Drug use: No   Sexual activity: Yes    Birth control/protection: Condom  Other Topics Concern   Not on file  Social History Narrative   Not on file   Social Drivers of Health   Financial Resource Strain: Not on file  Food Insecurity: Not on file  Transportation Needs: Not on file  Physical Activity: Not on file  Stress: Not on file  Social Connections: Not on file  Intimate Partner Violence: Not on file      Review of Systems  Constitutional:  Positive for fatigue.  HENT: Negative.    Respiratory: Negative.  Negative for cough, chest tightness, shortness of breath and wheezing.   Cardiovascular: Negative.  Negative for chest pain and palpitations.  Gastrointestinal:  Positive for abdominal distention, abdominal pain and constipation.  Genitourinary:  Positive for difficulty urinating, dysuria and penile pain.       Groin pain  Musculoskeletal:  Positive for arthralgias, back pain, gait problem and myalgias (right thigh).  Neurological:  Positive for numbness (intermittent numbness and tingling in the groin area and bilateral thighs).  Psychiatric/Behavioral:  Positive for behavioral problems and sleep disturbance. Negative for self-injury and suicidal ideas. The patient is nervous/anxious.     Vital Signs: BP 116/82   Pulse 81   Temp 98.3 F (36.8 C)   Resp 16   Ht 5' 7 (1.702 m)   Wt 159 lb (72.1 kg)   SpO2 95%   BMI 24.90 kg/m    Physical Exam Vitals reviewed.  Constitutional:      General: He is not in acute distress.    Appearance: Normal appearance. He is not ill-appearing.  HENT:     Head: Normocephalic and atraumatic.  Eyes:     Pupils: Pupils are equal, round, and reactive to light.  Cardiovascular:     Rate and Rhythm: Normal rate and regular rhythm.  Pulmonary:     Effort: Pulmonary effort is normal. No respiratory  distress.  Musculoskeletal:     Thoracic back: Spasms and tenderness present. Decreased range of motion.     Lumbar back: Spasms and tenderness present. Decreased range of motion.       Legs:     Comments: Marked area shows where the low back pain started and that it travels down his right leg causing sciatica.  Skin:    General: Skin is warm and dry.     Capillary Refill: Capillary refill takes less than 2 seconds.  Neurological:     Mental Status: He is alert and oriented to person, place, and time.  Psychiatric:        Mood and Affect: Mood normal.        Behavior: Behavior normal.        Assessment/Plan: 1. 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 Thoracic spondylosis Referred to neurosurgery - Ambulatory referral to Neurosurgery  3. Lumbar spondylosis with myelopathy  Referred to neurosurgery - Ambulatory referral to Neurosurgery  4. Chronic neck pain Referred to neurosurgery - Ambulatory referral to Neurosurgery  5. Irritable bowel syndrome with constipation Followed by GI, continue medications as prescribed  6. Proctitis Noted on MRI, will follow up with GI   General Counseling: Honora oakland understanding of the findings of todays visit and agrees with plan of treatment. I have discussed any further diagnostic evaluation that may be needed or ordered today. We also reviewed his medications today. he has been encouraged to call the office with any questions or concerns that should arise related to todays visit.    Orders Placed This Encounter  Procedures   Ambulatory referral to Neurosurgery    Meds ordered this encounter  Medications   DISCONTD: linaclotide  (LINZESS ) 72 MCG capsule    Sig: TAKE 1 CAPSULE DAILY AS NEEDED FOR CONSTIPATION    Dispense:  90 capsule    Refill:  3    Please send renewal request or prior auth request   DISCONTD: pregabalin  (LYRICA ) 50  MG capsule    Sig: Take 1 capsule (50 mg total) by mouth 2 (two) times daily.    Dispense:  60 capsule    Refill:  2    Fill new script, note increased dose. Discontinue 25 mg dose    Return for upcoming CPE in october. .   Total time spent:30 Minutes Time spent includes review of chart, medications, test results, and follow up plan with the patient.   [place] Controlled Substance Database was reviewed by me.  This patient was seen by Mardy Maxin, FNP-C in collaboration with Dr. Sigrid Bathe as a part of collaborative care agreement.   Joyclyn Plazola R. Maxin, MSN, FNP-C Internal medicine

## 2024-06-16 ENCOUNTER — Telehealth: Payer: Self-pay | Admitting: Nurse Practitioner

## 2024-06-16 NOTE — Telephone Encounter (Signed)
 Awaiting 06/15/24 office notes for Neurosurgery referral-Toni

## 2024-06-22 ENCOUNTER — Encounter: Admitting: Nurse Practitioner

## 2024-06-23 ENCOUNTER — Telehealth: Payer: Self-pay | Admitting: Nurse Practitioner

## 2024-06-23 NOTE — Telephone Encounter (Signed)
 Left vm and sent mychart message to confirm 06/30/24 appointment-Toni

## 2024-06-29 ENCOUNTER — Telehealth: Payer: Self-pay | Admitting: Nurse Practitioner

## 2024-06-29 NOTE — Telephone Encounter (Signed)
 Lvm & sent mychart msg to move up 06/30/24 appointment & if okay to see dfk -Andree

## 2024-06-30 ENCOUNTER — Encounter: Payer: Self-pay | Admitting: Nurse Practitioner

## 2024-06-30 ENCOUNTER — Ambulatory Visit (INDEPENDENT_AMBULATORY_CARE_PROVIDER_SITE_OTHER): Admitting: Nurse Practitioner

## 2024-06-30 VITALS — BP 120/82 | HR 71 | Temp 97.7°F | Resp 16 | Ht 67.0 in | Wt 158.0 lb

## 2024-06-30 DIAGNOSIS — R7303 Prediabetes: Secondary | ICD-10-CM

## 2024-06-30 DIAGNOSIS — Z125 Encounter for screening for malignant neoplasm of prostate: Secondary | ICD-10-CM

## 2024-06-30 DIAGNOSIS — M4716 Other spondylosis with myelopathy, lumbar region: Secondary | ICD-10-CM

## 2024-06-30 DIAGNOSIS — K581 Irritable bowel syndrome with constipation: Secondary | ICD-10-CM

## 2024-06-30 DIAGNOSIS — M4807 Spinal stenosis, lumbosacral region: Secondary | ICD-10-CM

## 2024-06-30 DIAGNOSIS — F411 Generalized anxiety disorder: Secondary | ICD-10-CM

## 2024-06-30 DIAGNOSIS — Z0001 Encounter for general adult medical examination with abnormal findings: Secondary | ICD-10-CM

## 2024-06-30 DIAGNOSIS — E782 Mixed hyperlipidemia: Secondary | ICD-10-CM | POA: Diagnosis not present

## 2024-06-30 DIAGNOSIS — N1831 Chronic kidney disease, stage 3a: Secondary | ICD-10-CM | POA: Diagnosis not present

## 2024-06-30 DIAGNOSIS — R300 Dysuria: Secondary | ICD-10-CM

## 2024-06-30 MED ORDER — LINACLOTIDE 72 MCG PO CAPS: ORAL_CAPSULE | ORAL | 3 refills | Status: DC

## 2024-06-30 NOTE — Progress Notes (Deleted)
 Referring Physician:  Liana Fish, NP [address] [place],  [place] [address]  Primary Physician:  Liana Fish, NP  History of Present Illness: 06/30/2024 Roger Mclaughlin is here today with a chief complaint of ***  Lumbar spondylosis with myelopathy. Lumber pain radiates to the thoracic spine and down the right leg with sciatica. He has difficulty sitting up due to the groin pain and pain laying on back.  Duration: *** Location: *** Quality: *** Severity: ***  Precipitating: aggravated by *** Modifying factors: made better by *** Weakness: none Timing: *** Bowel/Bladder Dysfunction: none  Conservative measures:  Physical therapy: *** Has not participated in? Multimodal medical therapy including regular antiinflammatories: *** celecoxib , pregabalin  Injections: *** epidural steroid injections?  Past Surgery: ***  Roger Mclaughlin has ***no symptoms of cervical myelopathy.  The symptoms are causing a significant impact on the patient's life.   Review of Systems:  A 10 point review of systems is negative, except for the pertinent positives and negatives detailed in the HPI.  Past Medical History: Past Medical History:  Diagnosis Date   Chronic sinusitis    Constipation    GERD (gastroesophageal reflux disease)    Hypercholesteremia    Hypothyroidism    Irritable bowel syndrome     Past Surgical History: Past Surgical History:  Procedure Laterality Date   COLONOSCOPY     COLONOSCOPY WITH PROPOFOL  N/A 01/23/2017   Procedure: COLONOSCOPY WITH PROPOFOL ;  Surgeon: Therisa Bi, MD;  Location: [facility] ENDOSCOPY;  Service: Endoscopy;  Laterality: N/A;   COLONOSCOPY WITH PROPOFOL  N/A 07/05/2022   Procedure: COLONOSCOPY WITH PROPOFOL ;  Surgeon: Maryruth Ole DASEN, MD;  Location: [facility] ENDOSCOPY;  Service: Endoscopy;  Laterality: N/A;  Please leave as a late morning appointment due to patient's transporation.   ESOPHAGOGASTRODUODENOSCOPY     WISDOM TOOTH  EXTRACTION      Allergies: Allergies as of 07/12/2024   (No Known Allergies)    Medications: Outpatient Encounter Medications as of 07/12/2024  Medication Sig   amitriptyline  (ELAVIL ) 50 MG tablet Take 1 tablet (50 mg total) by mouth at bedtime. (Patient not taking: Reported on 06/30/2024)   atorvastatin  (LIPITOR) 40 MG tablet Take 1 tablet (40 mg total) by mouth daily.   celecoxib  (CELEBREX ) 200 MG capsule Take 1 capsule (200 mg total) by mouth 2 (two) times daily. (Patient not taking: Reported on 06/30/2024)   esomeprazole  (NEXIUM ) 40 MG capsule TAKE 1 CAPSULE DAILY AT 12 NOON   linaclotide  (LINZESS ) 72 MCG capsule TAKE 1 CAPSULE DAILY AS NEEDED FOR CONSTIPATION   Magnesium  500 MG TABS Take 2 tablets by mouth daily.   polyethylene glycol powder (GLYCOLAX/MIRALAX) 17 GM/SCOOP powder Take 1 Container by mouth every evening.   pregabalin  (LYRICA ) 50 MG capsule Take 1 capsule (50 mg total) by mouth 2 (two) times daily. (Patient not taking: Reported on 06/30/2024)   traZODone  (DESYREL ) 50 MG tablet TAKE ONE-HALF (1/2) TO ONE TABLET AT BEDTIME AS NEEDED FOR SLEEP   No facility-administered encounter medications on file as of 07/12/2024.    Social History: Social History   Tobacco Use   Smoking status: Never   Smokeless tobacco: Never  Vaping Use   Vaping status: Never Used  Substance Use Topics   Alcohol use: Yes    Alcohol/week: 7.0 standard drinks of alcohol    Types: 7 Glasses of wine per week    Comment: 1 glass of wine a day   Drug use: No    Family Medical History: Family History  Problem Relation Age of Onset   Irritable bowel syndrome Mother    Hypertension Mother    Diabetes Mother    Prostate cancer Father    Hypertension Father    Diabetes Father    Irritable bowel syndrome Sister     Physical Examination: @VITALWITHPAIN @  General: Patient is well developed, well nourished, calm, collected, and in no apparent distress. Attention to examination is  appropriate.  Psychiatric: Patient is non-anxious.  Head:  Pupils equal, round, and reactive to light.  ENT:  Oral mucosa appears well hydrated.  Neck:   Supple.  ***Full range of motion.  Respiratory: Patient is breathing without any difficulty.  Extremities: No edema.  Vascular: Palpable dorsal pedal pulses.  Skin:   On exposed skin, there are no abnormal skin lesions.  NEUROLOGICAL:     Awake, alert, oriented to person, place, and time.  Speech is clear and fluent. Fund of knowledge is appropriate.   Cranial Nerves: Pupils equal round and reactive to light.  Facial tone is symmetric.  Facial sensation is symmetric.  ROM of spine: ***full.  Palpation of spine: ***non tender.    Strength: Side Biceps Triceps Deltoid Interossei Grip Wrist Ext. Wrist Flex.  R 5 5 5 5 5 5 5   L 5 5 5 5 5 5 5    Side Iliopsoas Quads Hamstring PF DF EHL  R 5 5 5 5 5 5   L 5 5 5 5 5 5    Reflexes are ***2+ and symmetric at the biceps, triceps, brachioradialis, patella and achilles.   Hoffman's is absent.  Clonus is not present.  Toes are down-going.  Bilateral upper and lower extremity sensation is intact to light touch.    Gait is normal.   No difficulty with tandem gait.   No evidence of dysmetria noted.  Medical Decision Making  Imaging: ***  I have personally reviewed the images and agree with the above interpretation.  Assessment and Plan: Roger Mclaughlin is a pleasant 63 y.o. male with ***    Thank you for involving me in the care of this patient.   I spent a total of *** minutes in both face-to-face and non-face-to-face activities for this visit on the date of this encounter.   Lyle Decamp, PA-C Dept. of Neurosurgery

## 2024-06-30 NOTE — Progress Notes (Signed)
 [facility] [address] [place], [place] [address]  Internal MEDICINE  Office Visit Note  Patient Name: Roger Mclaughlin  [id number]  [id number]  Date of Service: 06/30/2024  Chief Complaint  Patient presents with   Gastroesophageal Reflux   Hyperlipidemia   Annual Exam    HPI Roger Mclaughlin presents for an annual well visit and physical exam.  Well-appearing 63 y.o. male with GERD, mixed IBS, hypothyroidism, insomnia and high cholesterol.  Routine CRC screening: up to date. Sees GI Labs: due for routine labs  New or worsening pain: having pain but has upcoming appt with neurosurgery.  Other concerns: still waiting for PA to be done for Linzess .      Current Medication: Outpatient Encounter Medications as of 06/30/2024  Medication Sig   amitriptyline  (ELAVIL ) 50 MG tablet Take 1 tablet (50 mg total) by mouth at bedtime. (Patient not taking: Reported on 06/30/2024)   atorvastatin  (LIPITOR) 40 MG tablet Take 1 tablet (40 mg total) by mouth daily.   celecoxib  (CELEBREX ) 200 MG capsule Take 1 capsule (200 mg total) by mouth 2 (two) times daily. (Patient not taking: Reported on 06/30/2024)   esomeprazole  (NEXIUM ) 40 MG capsule TAKE 1 CAPSULE DAILY AT 12 NOON   linaclotide  (LINZESS ) 72 MCG capsule TAKE 1 CAPSULE DAILY AS NEEDED FOR CONSTIPATION   Magnesium  500 MG TABS Take 2 tablets by mouth daily.   polyethylene glycol powder (GLYCOLAX/MIRALAX) 17 GM/SCOOP powder Take 1 Container by mouth every evening.   pregabalin  (LYRICA ) 50 MG capsule Take 1 capsule (50 mg total) by mouth 2 (two) times daily. (Patient not taking: Reported on 06/30/2024)   traZODone  (DESYREL ) 50 MG tablet TAKE ONE-HALF (1/2) TO ONE TABLET AT BEDTIME AS NEEDED FOR SLEEP   [DISCONTINUED] linaclotide  (LINZESS ) 72 MCG capsule TAKE 1 CAPSULE DAILY AS NEEDED FOR CONSTIPATION   No facility-administered encounter medications on file as of 06/30/2024.    Surgical History: Past Surgical History:  Procedure  Laterality Date   COLONOSCOPY     COLONOSCOPY WITH PROPOFOL  N/A 01/23/2017   Procedure: COLONOSCOPY WITH PROPOFOL ;  Surgeon: Therisa Bi, MD;  Location: [facility] ENDOSCOPY;  Service: Endoscopy;  Laterality: N/A;   COLONOSCOPY WITH PROPOFOL  N/A 07/05/2022   Procedure: COLONOSCOPY WITH PROPOFOL ;  Surgeon: Maryruth Ole DASEN, MD;  Location: [facility] ENDOSCOPY;  Service: Endoscopy;  Laterality: N/A;  Please leave as a late morning appointment due to patient's transporation.   ESOPHAGOGASTRODUODENOSCOPY     WISDOM TOOTH EXTRACTION      Medical History: Past Medical History:  Diagnosis Date   Chronic sinusitis    Constipation    GERD (gastroesophageal reflux disease)    Hypercholesteremia    Hypothyroidism    Irritable bowel syndrome     Family History: Family History  Problem Relation Age of Onset   Irritable bowel syndrome Mother    Hypertension Mother    Diabetes Mother    Prostate cancer Father    Hypertension Father    Diabetes Father    Irritable bowel syndrome Sister     Social History   Socioeconomic History   Marital status: Single    Spouse name: Not on file   Number of children: Not on file   Years of education: Not on file   Highest education level: Not on file  Occupational History   Not on file  Tobacco Use   Smoking status: Never   Smokeless tobacco: Never  Vaping Use   Vaping status: Never Used  Substance and Sexual Activity  Alcohol use: Yes    Alcohol/week: 7.0 standard drinks of alcohol    Types: 7 Glasses of wine per week    Comment: 1 glass of wine a day   Drug use: No   Sexual activity: Yes    Birth control/protection: Condom  Other Topics Concern   Not on file  Social History Narrative   Not on file   Social Drivers of Health   Financial Resource Strain: Not on file  Food Insecurity: Not on file  Transportation Needs: Not on file  Physical Activity: Not on file  Stress: Not on file  Social Connections: Not on file  Intimate Partner  Violence: Not on file      Review of Systems  Constitutional:  Positive for fatigue. Negative for activity change, appetite change, chills, fever and unexpected weight change.  HENT: Negative.  Negative for congestion, ear pain, rhinorrhea, sore throat and trouble swallowing.   Eyes: Negative.   Respiratory: Negative.  Negative for cough, chest tightness, shortness of breath and wheezing.   Cardiovascular: Negative.  Negative for chest pain and palpitations.  Gastrointestinal:  Positive for abdominal distention, abdominal pain and constipation. Negative for blood in stool, diarrhea, nausea and vomiting.  Endocrine: Negative.   Genitourinary:  Positive for difficulty urinating, dysuria and penile pain. Negative for frequency, hematuria and urgency.       Groin pain  Musculoskeletal:  Positive for arthralgias, back pain, gait problem and myalgias (right thigh). Negative for joint swelling and neck pain.  Skin: Negative.  Negative for rash and wound.  Allergic/Immunologic: Negative.  Negative for immunocompromised state.  Neurological:  Positive for numbness (intermittent numbness and tingling in the groin area and bilateral thighs). Negative for dizziness, seizures and headaches.  Hematological: Negative.   Psychiatric/Behavioral:  Positive for behavioral problems and sleep disturbance. Negative for self-injury and suicidal ideas. The patient is nervous/anxious.     Vital Signs: BP 120/82   Pulse 71   Temp 97.7 F (36.5 C)   Resp 16   Ht 5' 7 (1.702 m)   Wt 158 lb (71.7 kg)   SpO2 99%   BMI 24.75 kg/m    Physical Exam Vitals reviewed.  Constitutional:      General: He is awake. He is not in acute distress.    Appearance: Normal appearance. He is well-developed, well-groomed and overweight. He is not ill-appearing.  HENT:     Head: Normocephalic and atraumatic.     Right Ear: Tympanic membrane, ear canal and external ear normal.     Left Ear: Tympanic membrane, ear canal and  external ear normal.     Nose: Nose normal. No congestion or rhinorrhea.     Mouth/Throat:     Mouth: Mucous membranes are moist.     Pharynx: Oropharynx is clear. No oropharyngeal exudate or posterior oropharyngeal erythema.  Eyes:     General: Lids are normal. Vision grossly intact. Gaze aligned appropriately.     Extraocular Movements: Extraocular movements intact.     Conjunctiva/sclera: Conjunctivae normal.     Pupils: Pupils are equal, round, and reactive to light.     Funduscopic exam:    Right eye: Red reflex present.        Left eye: Red reflex present. Neck:     Vascular: No carotid bruit.  Cardiovascular:     Rate and Rhythm: Normal rate and regular rhythm.     Pulses: Normal pulses.     Heart sounds: Normal heart sounds. No murmur heard.  Pulmonary:     Effort: Pulmonary effort is normal. No respiratory distress.     Breath sounds: Normal breath sounds. No wheezing.  Abdominal:     General: Bowel sounds are normal.     Palpations: Abdomen is soft.  Musculoskeletal:     Cervical back: Normal range of motion and neck supple.     Thoracic back: Spasms and tenderness present. Decreased range of motion.     Lumbar back: Spasms and tenderness present. Decreased range of motion.       Legs:     Comments: Marked area shows where the low back pain started and that it travels down his right leg causing sciatica.  Lymphadenopathy:     Cervical: No cervical adenopathy.  Skin:    General: Skin is warm and dry.     Capillary Refill: Capillary refill takes less than 2 seconds.  Neurological:     Mental Status: He is alert and oriented to person, place, and time.  Psychiatric:        Mood and Affect: Mood normal.        Behavior: Behavior normal. Behavior is cooperative.        Thought Content: Thought content normal.        Judgment: Judgment normal.        Assessment/Plan: 1. Encounter for routine adult health examination with abnormal findings  (Primary) Age-appropriate preventive screenings and vaccinations discussed, annual physical exam completed. Routine labs for health maintenance ordered, see below. PHM updated.   - CBC with Differential/Platelet - CMP14+EGFR - Lipid Profile - Hgb A1C w/o eAG  2. Prediabetes Routine labs ordered  - CBC with Differential/Platelet - CMP14+EGFR - Lipid Profile - Hgb A1C w/o eAG  3. Stage 3a chronic kidney disease (HCC) Routine labs ordered  - CBC with Differential/Platelet - CMP14+EGFR - Lipid Profile - Hgb A1C w/o eAG  4. Mixed hyperlipidemia Routine labs ordered  - CBC with Differential/Platelet - CMP14+EGFR - Lipid Profile - Hgb A1C w/o eAG  5. Irritable bowel syndrome with constipation Follow up with GI, take linzess  as prescribed.  - linaclotide  (LINZESS ) 72 MCG capsule; TAKE 1 CAPSULE DAILY AS NEEDED FOR CONSTIPATION  Dispense: 90 capsule; Refill: 3  6. Lumbar spondylosis with myelopathy Has upcoming appt with neurosurgery  7. Neural foraminal stenosis of lumbosacral spine Has upcoming appt with neurosurgery  8. Dysuria Urine specimen sent to lab - UA/M w/rflx Culture, Routine - Microscopic Examination  9. Screening for prostate cancer Routine lab ordered  - PSA Total (Reflex To Free)  10. Generalized anxiety disorder Takes trazodone  at night. Anxiety is manageable most of the time      General Counseling: duy lemming understanding of the findings of todays visit and agrees with plan of treatment. I have discussed any further diagnostic evaluation that may be needed or ordered today. We also reviewed his medications today. he has been encouraged to call the office with any questions or concerns that should arise related to todays visit.    Orders Placed This Encounter  Procedures   UA/M w/rflx Culture, Routine   CBC with Differential/Platelet   CMP14+EGFR   Lipid Profile   Hgb A1C w/o eAG   PSA Total (Reflex To Free)    Meds ordered this  encounter  Medications   linaclotide  (LINZESS ) 72 MCG capsule    Sig: TAKE 1 CAPSULE DAILY AS NEEDED FOR CONSTIPATION    Dispense:  90 capsule    Refill:  3    Please send  renewal request or prior auth request asap, this needs to be done and we have not received the request yet.    Return in about 1 month (around 07/31/2024) for F/U, Zakari Bathe PCP, Labs.   Total time spent:30 Minutes Time spent includes review of chart, medications, test results, and follow up plan with the patient.   [place] Controlled Substance Database was reviewed by me.  This patient was seen by Mardy Maxin, FNP-C in collaboration with Dr. Sigrid Bathe as a part of collaborative care agreement.  Riane Rung R. Maxin, MSN, FNP-C Internal medicine

## 2024-07-01 LAB — CMP14+EGFR
ALT: 40 IU/L (ref 0–44)
AST: 33 IU/L (ref 0–40)
Albumin: 4.7 g/dL (ref 3.9–4.9)
Alkaline Phosphatase: 91 IU/L (ref 47–123)
BUN/Creatinine Ratio: 12 (ref 10–24)
BUN: 15 mg/dL (ref 8–27)
Bilirubin Total: 0.8 mg/dL (ref 0.0–1.2)
CO2: 23 mmol/L (ref 20–29)
Calcium: 10 mg/dL (ref 8.6–10.2)
Chloride: 102 mmol/L (ref 96–106)
Creatinine, Ser: 1.23 mg/dL (ref 0.76–1.27)
Globulin, Total: 2.3 g/dL (ref 1.5–4.5)
Glucose: 84 mg/dL (ref 70–99)
Potassium: 4.6 mmol/L (ref 3.5–5.2)
Sodium: 140 mmol/L (ref 134–144)
Total Protein: 7 g/dL (ref 6.0–8.5)
eGFR: 66 mL/min/1.73 (ref >59)

## 2024-07-01 LAB — MICROSCOPIC EXAMINATION
Bacteria, UA: NONE SEEN
Casts: NONE SEEN /LPF
Epithelial Cells (non renal): NONE SEEN /HPF (ref 0–10)
WBC, UA: NONE SEEN /HPF (ref 0–5)

## 2024-07-01 LAB — UA/M W/RFLX CULTURE, ROUTINE
Bilirubin, UA: NEGATIVE
Glucose, UA: NEGATIVE
Ketones, UA: NEGATIVE
Leukocytes,UA: NEGATIVE
Nitrite, UA: NEGATIVE
Protein,UA: NEGATIVE
RBC, UA: NEGATIVE
Specific Gravity, UA: 1.013 (ref 1.005–1.030)
Urobilinogen, Ur: 0.2 mg/dL (ref 0.2–1.0)
pH, UA: 7.5 (ref 5.0–7.5)

## 2024-07-01 LAB — CBC WITH DIFFERENTIAL/PLATELET
Basophils Absolute: 0 x10E3/uL (ref 0.0–0.2)
Basos: 1 %
EOS (ABSOLUTE): 0.1 x10E3/uL (ref 0.0–0.4)
Eos: 2 %
Hematocrit: 47.5 % (ref 37.5–51.0)
Hemoglobin: 15.2 g/dL (ref 13.0–17.7)
Immature Grans (Abs): 0 x10E3/uL (ref 0.0–0.1)
Immature Granulocytes: 0 %
Lymphocytes Absolute: 1.6 x10E3/uL (ref 0.7–3.1)
Lymphs: 44 %
MCH: 29.9 pg (ref 26.6–33.0)
MCHC: 32 g/dL (ref 31.5–35.7)
MCV: 93 fL (ref 79–97)
Monocytes Absolute: 0.3 x10E3/uL (ref 0.1–0.9)
Monocytes: 9 %
Neutrophils Absolute: 1.5 x10E3/uL (ref 1.4–7.0)
Neutrophils: 44 %
Platelets: 170 x10E3/uL (ref 150–450)
RBC: 5.09 x10E6/uL (ref 4.14–5.80)
RDW: 12.1 % (ref 11.6–15.4)
WBC: 3.5 x10E3/uL (ref 3.4–10.8)

## 2024-07-01 LAB — HGB A1C W/O EAG: Hgb A1c MFr Bld: 5.5 % (ref 4.8–5.6)

## 2024-07-01 LAB — LIPID PANEL
Chol/HDL Ratio: 2.4 ratio (ref 0.0–5.0)
Cholesterol, Total: 197 mg/dL (ref 100–199)
HDL: 83 mg/dL (ref >39)
LDL Chol Calc (NIH): 102 mg/dL — ABNORMAL HIGH (ref 0–99)
Triglycerides: 64 mg/dL (ref 0–149)
VLDL Cholesterol Cal: 12 mg/dL (ref 5–40)

## 2024-07-01 LAB — PSA TOTAL (REFLEX TO FREE): Prostate Specific Ag, Serum: 1.5 ng/mL (ref 0.0–4.0)

## 2024-07-05 ENCOUNTER — Telehealth: Payer: Self-pay | Admitting: Nurse Practitioner

## 2024-07-05 NOTE — Telephone Encounter (Addendum)
 Received MR request from [facility]  Awaiting 06/15/24 & 06/30/24 office notes.

## 2024-07-06 ENCOUNTER — Ambulatory Visit: Admitting: Physician Assistant

## 2024-07-06 ENCOUNTER — Ambulatory Visit (INDEPENDENT_AMBULATORY_CARE_PROVIDER_SITE_OTHER)

## 2024-07-06 ENCOUNTER — Encounter: Payer: Self-pay | Admitting: Physician Assistant

## 2024-07-06 VITALS — BP 116/76 | Ht 67.5 in | Wt 156.6 lb

## 2024-07-06 DIAGNOSIS — M5127 Other intervertebral disc displacement, lumbosacral region: Secondary | ICD-10-CM | POA: Diagnosis not present

## 2024-07-06 DIAGNOSIS — M5416 Radiculopathy, lumbar region: Secondary | ICD-10-CM

## 2024-07-06 DIAGNOSIS — G8929 Other chronic pain: Secondary | ICD-10-CM

## 2024-07-06 DIAGNOSIS — M5441 Lumbago with sciatica, right side: Secondary | ICD-10-CM

## 2024-07-06 MED ORDER — GABAPENTIN 300 MG PO CAPS: 300.0000 mg | ORAL_CAPSULE | Freq: Three times a day (TID) | ORAL | 2 refills | Status: DC

## 2024-07-06 NOTE — Progress Notes (Signed)
 Referring Physician:  Liana Fish, NP [address] [place],  [place] [address]  Primary Physician:  Liana Fish, NP  History of Present Illness: 07/06/2024 [name] is here today with a chief complaint of back pain x 8 months that radiates in to his groin as well as down the back and side of his legs to his foot.  He feels as though he has numbness and tingling in his shins ankles and feet, right side worse than left.  This is worse with working around the house, sitting or lying down.  He also notices that when he is straining on the toilet it triggers his pain.  Leaning forward does help his pain some.  No saddle anesthesia or incontinence to bowel or bladder.   Weakness: none Bowel/Bladder Dysfunction: none  Conservative measures:  Physical therapypelvic floor, not back pt Multimodal medical therapy including regular antiinflammatories:  celecoxib , pregabalin  Injections: No epidural steroid injections.  Past Surgery:   Roger Mclaughlin has no symptoms of cervical myelopathy.  The symptoms are causing a significant impact on the patient's life.   Review of Systems:  A 10 point review of systems is negative, except for the pertinent positives and negatives detailed in the HPI.  Past Medical History: Past Medical History:  Diagnosis Date   Chronic sinusitis    Constipation    GERD (gastroesophageal reflux disease)    Hypercholesteremia    Hypothyroidism    Irritable bowel syndrome     Past Surgical History: Past Surgical History:  Procedure Laterality Date   COLONOSCOPY     COLONOSCOPY WITH PROPOFOL  N/A 01/23/2017   Procedure: COLONOSCOPY WITH PROPOFOL ;  Surgeon: Roger Bi, MD;  Location: [facility] ENDOSCOPY;  Service: Endoscopy;  Laterality: N/A;   COLONOSCOPY WITH PROPOFOL  N/A 07/05/2022   Procedure: COLONOSCOPY WITH PROPOFOL ;  Surgeon: Roger Ole DASEN, MD;  Location: [facility] ENDOSCOPY;  Service: Endoscopy;  Laterality: N/A;  Please leave as a late  morning appointment due to patient's transporation.   ESOPHAGOGASTRODUODENOSCOPY     WISDOM TOOTH EXTRACTION      Allergies: Allergies as of 07/06/2024   (No Known Allergies)    Medications: Outpatient Encounter Medications as of 07/06/2024  Medication Sig   atorvastatin  (LIPITOR) 40 MG tablet Take 1 tablet (40 mg total) by mouth daily.   esomeprazole  (NEXIUM ) 40 MG capsule TAKE 1 CAPSULE DAILY AT 12 NOON   linaclotide  (LINZESS ) 72 MCG capsule TAKE 1 CAPSULE DAILY AS NEEDED FOR CONSTIPATION   Magnesium  500 MG TABS Take 2 tablets by mouth daily.   polyethylene glycol powder (GLYCOLAX/MIRALAX) 17 GM/SCOOP powder Take 1 Container by mouth every evening.   traZODone  (DESYREL ) 50 MG tablet TAKE ONE-HALF (1/2) TO ONE TABLET AT BEDTIME AS NEEDED FOR SLEEP   [DISCONTINUED] amitriptyline  (ELAVIL ) 50 MG tablet Take 1 tablet (50 mg total) by mouth at bedtime. (Patient not taking: Reported on 06/30/2024)   [DISCONTINUED] celecoxib  (CELEBREX ) 200 MG capsule Take 1 capsule (200 mg total) by mouth 2 (two) times daily. (Patient not taking: Reported on 06/30/2024)   [DISCONTINUED] pregabalin  (LYRICA ) 50 MG capsule Take 1 capsule (50 mg total) by mouth 2 (two) times daily. (Patient not taking: Reported on 06/30/2024)   No facility-administered encounter medications on file as of 07/06/2024.    Social History: Social History   Tobacco Use   Smoking status: Never   Smokeless tobacco: Never  Vaping Use   Vaping status: Never Used  Substance Use Topics   Alcohol use: Yes  Alcohol/week: 7.0 standard drinks of alcohol    Types: 7 Glasses of wine per week    Comment: 1 glass of wine a day   Drug use: No    Family Medical History: Family History  Problem Relation Age of Onset   Irritable bowel syndrome Mother    Hypertension Mother    Diabetes Mother    Prostate cancer Father    Hypertension Father    Diabetes Father    Irritable bowel syndrome Sister     Physical  Examination: @VITALWITHPAIN @  General: Patient is well developed, well nourished, calm, collected, and in no apparent distress. Attention to examination is appropriate.  Psychiatric: Patient is non-anxious.  Head:  Pupils equal, round, and reactive to light.  ENT:  Oral mucosa appears well hydrated.  Neck:   Supple.  Full range of motion.  Respiratory: Patient is breathing without any difficulty.  Extremities: No edema.  Vascular: Palpable dorsal pedal pulses.  Skin:   On exposed skin, there are no abnormal skin lesions.  NEUROLOGICAL:     Awake, alert, oriented to person, place, and time.  Speech is clear and fluent. Fund of knowledge is appropriate.   Cranial Nerves: Pupils equal round and reactive to light.  Facial tone is symmetric.   ROM of spine: Minimal tenderness palpation of lumbar paraspinals. No straight leg raise. No pain to internal and external rotation of his hip.  Strength:  Side Iliopsoas Quads Hamstring PF DF EHL  R 5 5 5 5 5 5   L 5 5 5 5 5 5    Reflexes are 1+ bilateral patella, trace Achilles Clonus is not present.  Toes are down-going.  Decree sensation bilateral lower extremities, worse at his feet. Gait is normal.   No difficulty with tandem gait.   No evidence of dysmetria noted.  Medical Decision Making  Imaging:  EXAM: MRI THORACIC SPINE WITHOUT CONTRAST   TECHNIQUE: Multiplanar, multisequence MR imaging of the thoracic spine was performed. No intravenous contrast was administered.   COMPARISON:  None Available.   FINDINGS: Alignment:  No vertebral subluxation is observed.   Vertebrae: Disc desiccation at all levels between T3 and T11. No significant thoracic vertebral edema signal noted.   There is evidence of cervical spondylosis and degenerative disc disease which could be assessed with diagnostic MRI of the cervical spine if clinically indicated.   Cord:  Unremarkable   Paraspinal and other soft tissues: Unremarkable    Disc levels:   T1-2: Unremarkable   T2-3: Unremarkable   T3-4: Unremarkable   T4-5: Unremarkable   T5-6: Unremarkable   T6-7: No impingement.  Minimal disc bulge   T7-8: No impingement. Right paracentral disc protrusion thins the ventral subarachnoid space on the right.   T9-10: No impingement.  Disc bulge noted.   T10-11: Unremarkable   T11-12: Unremarkable   T12-L1: Unremarkable   IMPRESSION: 1. Thoracic spondylosis and degenerative disc disease, but without observed impingement. 2. Cervical spondylosis and degenerative disc disease which could be assessed with diagnostic MRI of the cervical spine if clinically indicated. I have personally reviewed the images and agree with the above interpretation.  EXAM: MRI LUMBAR SPINE WITHOUT CONTRAST   TECHNIQUE: Multiplanar, multisequence MR imaging of the lumbar spine was performed. No intravenous contrast was administered.   COMPARISON:  None Available.   FINDINGS: Segmentation: The lowest lumbar type non-rib-bearing vertebra is labeled as L5.   Alignment:  No vertebral subluxation is observed.   Vertebrae: Type 2 degenerative endplate findings at L4-5  and L5-S1 with associated disc desiccation. Small Schmorl's node along the inferior endplate of L4.   Conus medullaris and cauda equina: Conus extends to the upper L2 level. Conus and cauda equina appear normal.   Paraspinal and other soft tissues: Unremarkable   Disc levels:   T12-L1: Unremarkable   L1-2: Unremarkable   L2-3: Unremarkable   L3-4: No impingement.  Mild disc bulge   L4-5: No impingement.  Left eccentric disc bulge   L5-S1: Moderate right foraminal stenosis due to facet spurring, disc bulge, and intervertebral spurring.   IMPRESSION: 1. Lumbar spondylosis and degenerative disc disease, causing moderate impingement at L5-S1.  Assessment and Plan: Mr. Court is a pleasant 63 y.o. male with back pain x 8 months radiating to bilateral  lower extremities, right worse than left.  MRI showing moderate impingement at L5-S1 on the right side.  He also has a left eccentric disc bulge at L4-5.  He has full strength on examination.  Plan is as follows:  -Patient was on gabapentin years ago.  Plan to retrial this medication.  Risks and benefits were explained to the patient. -X-rays to evaluate for dynamic listhesis. - I do think he could benefit from an injection.  Referral was placed to our pain team - Referral to physical therapy made. - Plan to see back in approximately 8 weeks.    Thank you for involving me in the care of this patient.   I spent a total of 45 minutes in both face-to-face and non-face-to-face activities for this visit on the date of this encounter including preparing to see the patient, obtaining and reviewing separately obtained history, performing medically appropriate examination, counseling the patient, ordering additional medications and tests, documenting clinical information, independently interpreting results, coordination of care.   Lyle Decamp, PA-C Dept. of Neurosurgery

## 2024-07-08 ENCOUNTER — Encounter: Payer: Self-pay | Admitting: Nurse Practitioner

## 2024-07-08 DIAGNOSIS — G8929 Other chronic pain: Secondary | ICD-10-CM | POA: Insufficient documentation

## 2024-07-08 DIAGNOSIS — M47814 Spondylosis without myelopathy or radiculopathy, thoracic region: Secondary | ICD-10-CM | POA: Insufficient documentation

## 2024-07-08 DIAGNOSIS — M4716 Other spondylosis with myelopathy, lumbar region: Secondary | ICD-10-CM | POA: Insufficient documentation

## 2024-07-08 DIAGNOSIS — N1831 Chronic kidney disease, stage 3a: Secondary | ICD-10-CM | POA: Insufficient documentation

## 2024-07-08 DIAGNOSIS — E782 Mixed hyperlipidemia: Secondary | ICD-10-CM | POA: Insufficient documentation

## 2024-07-08 DIAGNOSIS — M4807 Spinal stenosis, lumbosacral region: Secondary | ICD-10-CM | POA: Insufficient documentation

## 2024-07-08 DIAGNOSIS — R7303 Prediabetes: Secondary | ICD-10-CM | POA: Insufficient documentation

## 2024-07-12 ENCOUNTER — Ambulatory Visit: Admitting: Physician Assistant

## 2024-07-14 ENCOUNTER — Telehealth: Payer: Self-pay | Admitting: Nurse Practitioner

## 2024-07-14 ENCOUNTER — Ambulatory Visit: Admitting: Podiatry

## 2024-07-14 NOTE — Telephone Encounter (Signed)
 MR faxed to  [facility]; [phone]

## 2024-07-19 ENCOUNTER — Other Ambulatory Visit: Payer: Self-pay | Admitting: Nurse Practitioner

## 2024-07-21 ENCOUNTER — Ambulatory Visit: Admitting: Podiatry

## 2024-07-21 VITALS — Ht 67.5 in | Wt 156.6 lb

## 2024-07-21 DIAGNOSIS — G6289 Other specified polyneuropathies: Secondary | ICD-10-CM | POA: Diagnosis not present

## 2024-07-21 NOTE — Progress Notes (Signed)
 Subjective:  Patient ID: Roger Mclaughlin, male    DOB: 05-15-1961,  MRN: [id number]  Chief Complaint  Patient presents with   Foot Pain    Rm 9 Patient is here bilateral foot pain. Patient states pain in heel that radiates towards the calf. Pt is states tingling sensation in feet after prolong standing/walking. Pt recently dx with spinal stenosis, states condition may also effect sensation in feet.    Discussed the use of AI scribe software for clinical note transcription with the patient, who gave verbal consent to proceed.  History of Present Illness Roger Mclaughlin is a 63 year old male with spinal stenosis who presents with persistent foot pain.  He experiences persistent foot pain, primarily in the left foot, which has not worsened but remains unresolved. Initially, the pain was more severe, but it has since evolved into soreness, especially after prolonged walking or standing. He describes the pain as sometimes including a tingling sensation, which he refers to as numbness, although he maintains full sensation.  He has a history of receiving injections in both feet, which did not significantly alleviate the pain. He previously wore orthotics, but they increased soreness. Recently, he discovered that a small compression device helps alleviate discomfort in his left foot, which is smaller than the right, causing more movement in his shoe and subsequent ankle pain.  He experiences occasional pain in both calves, sometimes even when lying down, and wonders if this is related to his diagnosed spinal stenosis. He has recently started seeing a neurosurgeon for his back and has upcoming appointments with a pain clinic.  He recently acquired new shoes with maximum cushioning, which have helped reduce his foot pain. He has carpeted floors at home, which he finds beneficial.      Objective:    Physical Exam VASCULAR: DP and PT pulse palpable. Foot is warm and well-perfused. Capillary fill time  is brisk. DERMATOLOGIC: Normal skin turgor, texture, and temperature. No open lesions, rashes, or ulcerations. NEUROLOGIC: Normal sensation to light touch and pressure. No paresthesias on examination. ORTHOPEDIC: Smooth pain-free range of motion of all examined joints. No ecchymosis or bruising. No gross deformity. No pain to palpation, no reproducible pain in plantar fascia or heel. Negative Tinel's sign at tarsal tunnel. No radiating pain.   No images are attached to the encounter.    Results     Assessment:   1. Other polyneuropathy      Plan:  Patient was evaluated and treated and all questions answered.  Assessment and Plan Assessment & Plan Neuropathic foot pain likely secondary to lumbar spinal stenosis Chronic neuropathic foot pain, likely secondary to lumbar spinal stenosis. Pain is not consistent with plantar fasciitis as there is no tenderness upon palpation of the plantar fascia or heel. Negative Tinel's sign at the tarsal tunnel. Pain may be exacerbated by spinal stenosis affecting nerves in the lower extremity. Differential diagnosis includes Baxter's neuritis, but symptoms do not strongly suggest this. Nerve conduction studies and MRI are not recommended at this time as they are unlikely to change management. Current management focuses on addressing spinal stenosis with neurosurgical consultation and pain management. - Continue follow-up with neurosurgery for management of spinal stenosis. - Attend pain clinic appointment for potential interventions to manage pain. - Consider MRI if symptoms suggestive of plantar fasciitis return or if spinal pathologies are ruled out.  History of plantar fasciitis Previous plantar fasciitis with current symptoms not consistent with active plantar fasciitis. No acute pain upon palpation  of the plantar fascia. Current symptoms are more likely related to neuropathic pain rather than plantar fasciitis. - No further workup or testing for  plantar fasciitis at this time.      Return if symptoms worsen or fail to improve.

## 2024-07-27 ENCOUNTER — Other Ambulatory Visit: Payer: Self-pay | Admitting: Family Medicine

## 2024-07-27 DIAGNOSIS — M5412 Radiculopathy, cervical region: Secondary | ICD-10-CM

## 2024-07-29 ENCOUNTER — Ambulatory Visit: Admitting: Physician Assistant

## 2024-08-02 ENCOUNTER — Ambulatory Visit
Admission: RE | Admit: 2024-08-02 | Discharge: 2024-08-02 | Disposition: A | Discharge status: Home / Self Care | Source: Ambulatory Visit | Attending: Family Medicine | Admitting: Family Medicine

## 2024-08-02 DIAGNOSIS — M5412 Radiculopathy, cervical region: Secondary | ICD-10-CM

## 2024-08-04 ENCOUNTER — Ambulatory Visit: Admitting: Nurse Practitioner

## 2024-08-09 ENCOUNTER — Other Ambulatory Visit: Payer: Self-pay

## 2024-08-09 DIAGNOSIS — K581 Irritable bowel syndrome with constipation: Secondary | ICD-10-CM

## 2024-08-09 MED ORDER — LINACLOTIDE 72 MCG PO CAPS: 72.0000 ug | ORAL_CAPSULE | Freq: Every day | ORAL | 3 refills | Status: AC

## 2024-08-18 ENCOUNTER — Other Ambulatory Visit: Payer: Self-pay | Admitting: Nurse Practitioner

## 2024-08-18 DIAGNOSIS — G4700 Insomnia, unspecified: Secondary | ICD-10-CM

## 2024-08-31 ENCOUNTER — Ambulatory Visit: Admitting: Physician Assistant

## 2024-08-31 ENCOUNTER — Encounter: Payer: Self-pay | Admitting: Physician Assistant

## 2024-08-31 VITALS — BP 110/78 | Wt 156.0 lb

## 2024-08-31 DIAGNOSIS — R109 Unspecified abdominal pain: Secondary | ICD-10-CM

## 2024-08-31 MED ORDER — GABAPENTIN 300 MG PO CAPS: 600.0000 mg | ORAL_CAPSULE | Freq: Three times a day (TID) | ORAL | 2 refills | Status: DC

## 2024-08-31 NOTE — Progress Notes (Unsigned)
 Referring Physician:  Liana Fish, NP [address] [place],  [place] [address]  Primary Physician:  Liana Fish, NP  History of Present Illness: 08/31/2024 [name] is here today with a chief complaint of back pain x 8 months that radiates in to his groin as well as down the back and side of his legs to his foot.  He feels as though he has numbness and tingling in his shins ankles and feet, right side worse than left.  This is worse with working around the house, sitting or lying down.  He also notices that when he is straining on the toilet it triggers his pain.  Leaning forward does help his pain some.  No saddle anesthesia or incontinence to bowel or bladder.  Did have injection, helped some muted a bit. Hard to tell with stomach issues what is causing what. Still with numbness and tingling down legs. Gabapentin  is helping some, had to reduce some because of GI upset. Also doing PT, going well but not sure how well it is helping.    Weakness: none Bowel/Bladder Dysfunction: none  Conservative measures:  Physical therapypelvic floor, not back pt Multimodal medical therapy including regular antiinflammatories:  celecoxib , pregabalin  Injections: No epidural steroid injections.  Past Surgery:   Roger Mclaughlin has no symptoms of cervical myelopathy.  The symptoms are causing a significant impact on the patient's life.   Review of Systems:  A 10 point review of systems is negative, except for the pertinent positives and negatives detailed in the HPI.  Past Medical History: Past Medical History:  Diagnosis Date   Chronic sinusitis    Constipation    GERD (gastroesophageal reflux disease)    Hypercholesteremia    Hypothyroidism    Irritable bowel syndrome     Past Surgical History: Past Surgical History:  Procedure Laterality Date   COLONOSCOPY     COLONOSCOPY WITH PROPOFOL  N/A 01/23/2017   Procedure: COLONOSCOPY WITH PROPOFOL ;  Surgeon: Therisa Bi,  MD;  Location: [facility] ENDOSCOPY;  Service: Endoscopy;  Laterality: N/A;   COLONOSCOPY WITH PROPOFOL  N/A 07/05/2022   Procedure: COLONOSCOPY WITH PROPOFOL ;  Surgeon: Maryruth Ole DASEN, MD;  Location: [facility] ENDOSCOPY;  Service: Endoscopy;  Laterality: N/A;  Please leave as a late morning appointment due to patient's transporation.   ESOPHAGOGASTRODUODENOSCOPY     WISDOM TOOTH EXTRACTION      Allergies: Allergies as of 08/31/2024   (No Known Allergies)    Medications: Outpatient Encounter Medications as of 08/31/2024  Medication Sig   atorvastatin  (LIPITOR) 40 MG tablet TAKE 1 TABLET DAILY   esomeprazole  (NEXIUM ) 40 MG capsule TAKE 1 CAPSULE DAILY AT 12 NOON   gabapentin  (NEURONTIN ) 300 MG capsule Take 1 capsule (300 mg total) by mouth 3 (three) times daily.   linaclotide  (LINZESS ) 72 MCG capsule Take 1 capsule (72 mcg total) by mouth daily before breakfast. TAKE 1 CAPSULE DAILY AS NEEDED FOR CONSTIPATION   Magnesium  500 MG TABS Take 2 tablets by mouth daily.   polyethylene glycol powder (GLYCOLAX/MIRALAX) 17 GM/SCOOP powder Take 1 Container by mouth every evening.   traZODone  (DESYREL ) 50 MG tablet TAKE ONE-HALF (1/2) TO ONE TABLET AT BEDTIME AS NEEDED FOR SLEEP   glycerin adult 2 g suppository Place 1 suppository rectally.   No facility-administered encounter medications on file as of 08/31/2024.    Social History: Social History   Tobacco Use   Smoking status: Never   Smokeless tobacco: Never  Vaping Use   Vaping status: Never Used  Substance Use Topics   Alcohol use: Yes    Alcohol/week: 7.0 standard drinks of alcohol    Types: 7 Glasses of wine per week    Comment: 1 glass of wine a day   Drug use: No    Family Medical History: Family History  Problem Relation Age of Onset   Irritable bowel syndrome Mother    Hypertension Mother    Diabetes Mother    Prostate cancer Father    Hypertension Father    Diabetes Father    Irritable bowel syndrome Sister     Physical  Examination: @VITALWITHPAIN @  General: Patient is well developed, well nourished, calm, collected, and in no apparent distress. Attention to examination is appropriate.  Psychiatric: Patient is non-anxious.  Head:  Pupils equal, round, and reactive to light.  ENT:  Oral mucosa appears well hydrated.  Neck:   Supple.  Full range of motion.  Respiratory: Patient is breathing without any difficulty.  Extremities: No edema.  Vascular: Palpable dorsal pedal pulses.  Skin:   On exposed skin, there are no abnormal skin lesions.  NEUROLOGICAL:     Awake, alert, oriented to person, place, and time.  Speech is clear and fluent. Fund of knowledge is appropriate.   Cranial Nerves: Pupils equal round and reactive to light.  Facial tone is symmetric.   ROM of spine: Minimal tenderness palpation of lumbar paraspinals. No straight leg raise. No pain to internal and external rotation of his hip.  Strength:  Side Iliopsoas Quads Hamstring PF DF EHL  R 5 5 5 5 5 5   L 5 5 5 5 5 5    Reflexes are 1+ bilateral patella, trace Achilles Clonus is not present.  Toes are down-going.  Decree sensation bilateral lower extremities, worse at his feet. Gait is normal.   No difficulty with tandem gait.   No evidence of dysmetria noted.  Medical Decision Making  Imaging:  EXAM: MRI THORACIC SPINE WITHOUT CONTRAST   TECHNIQUE: Multiplanar, multisequence MR imaging of the thoracic spine was performed. No intravenous contrast was administered.   COMPARISON:  None Available.   FINDINGS: Alignment:  No vertebral subluxation is observed.   Vertebrae: Disc desiccation at all levels between T3 and T11. No significant thoracic vertebral edema signal noted.   There is evidence of cervical spondylosis and degenerative disc disease which could be assessed with diagnostic MRI of the cervical spine if clinically indicated.   Cord:  Unremarkable   Paraspinal and other soft tissues: Unremarkable    Disc levels:   T1-2: Unremarkable   T2-3: Unremarkable   T3-4: Unremarkable   T4-5: Unremarkable   T5-6: Unremarkable   T6-7: No impingement.  Minimal disc bulge   T7-8: No impingement. Right paracentral disc protrusion thins the ventral subarachnoid space on the right.   T9-10: No impingement.  Disc bulge noted.   T10-11: Unremarkable   T11-12: Unremarkable   T12-L1: Unremarkable   IMPRESSION: 1. Thoracic spondylosis and degenerative disc disease, but without observed impingement. 2. Cervical spondylosis and degenerative disc disease which could be assessed with diagnostic MRI of the cervical spine if clinically indicated. I have personally reviewed the images and agree with the above interpretation.  EXAM: MRI LUMBAR SPINE WITHOUT CONTRAST   TECHNIQUE: Multiplanar, multisequence MR imaging of the lumbar spine was performed. No intravenous contrast was administered.   COMPARISON:  None Available.   FINDINGS: Segmentation: The lowest lumbar type non-rib-bearing vertebra is labeled as L5.   Alignment:  No vertebral subluxation is  observed.   Vertebrae: Type 2 degenerative endplate findings at L4-5 and L5-S1 with associated disc desiccation. Small Schmorl's node along the inferior endplate of L4.   Conus medullaris and cauda equina: Conus extends to the upper L2 level. Conus and cauda equina appear normal.   Paraspinal and other soft tissues: Unremarkable   Disc levels:   T12-L1: Unremarkable   L1-2: Unremarkable   L2-3: Unremarkable   L3-4: No impingement.  Mild disc bulge   L4-5: No impingement.  Left eccentric disc bulge   L5-S1: Moderate right foraminal stenosis due to facet spurring, disc bulge, and intervertebral spurring.   IMPRESSION: 1. Lumbar spondylosis and degenerative disc disease, causing moderate impingement at L5-S1.  Assessment and Plan: Mr. Ord is a pleasant 63 y.o. male with back pain x 8 months radiating to bilateral  lower extremities, right worse than left.  MRI showing moderate impingement at L5-S1 on the right side.  He also has a left eccentric disc bulge at L4-5.  He has full strength on examination.  Plan is as follows:  -Patient was on gabapentin  years ago.  Plan to retrial this medication.  Risks and benefits were explained to the patient. -X-rays to evaluate for dynamic listhesis. - I do think he could benefit from an injection.  Referral was placed to our pain team - Referral to physical therapy made. - Plan to see back in approximately 8 weeks.  - continue pt, trial higher dse of gabapentin .   Thank you for involving me in the care of this patient.   I spent a total of 45 minutes in both face-to-face and non-face-to-face activities for this visit on the date of this encounter including preparing to see the patient, obtaining and reviewing separately obtained history, performing medically appropriate examination, counseling the patient, ordering additional medications and tests, documenting clinical information, independently interpreting results, coordination of care.   Lyle Decamp, PA-C Dept. of Neurosurgery

## 2024-09-26 ENCOUNTER — Other Ambulatory Visit: Payer: Self-pay | Admitting: Physician Assistant

## 2025-07-01 ENCOUNTER — Encounter: Admitting: Nurse Practitioner
